# Patient Record
Sex: Female | Born: 1980 | Race: Black or African American | Hispanic: No | Marital: Single | Smoking: Never smoker
Health system: Southern US, Community
[De-identification: ages and names within clinical notes are randomized; demographics above are authoritative.]

## PROBLEM LIST (undated history)

## (undated) DIAGNOSIS — G4730 Sleep apnea, unspecified: Secondary | ICD-10-CM

## (undated) DIAGNOSIS — E119 Type 2 diabetes mellitus without complications: Secondary | ICD-10-CM

## (undated) DIAGNOSIS — E785 Hyperlipidemia, unspecified: Secondary | ICD-10-CM

## (undated) DIAGNOSIS — I10 Essential (primary) hypertension: Secondary | ICD-10-CM

## (undated) HISTORY — DX: Sleep apnea, unspecified: G47.30

---

## 2019-06-21 ENCOUNTER — Other Ambulatory Visit: Payer: Self-pay

## 2019-06-21 ENCOUNTER — Encounter (HOSPITAL_COMMUNITY): Payer: Self-pay

## 2019-06-21 ENCOUNTER — Inpatient Hospital Stay (HOSPITAL_COMMUNITY): Payer: 59 | Admitting: Certified Registered"

## 2019-06-21 ENCOUNTER — Emergency Department (HOSPITAL_COMMUNITY): Payer: 59

## 2019-06-21 ENCOUNTER — Inpatient Hospital Stay (HOSPITAL_COMMUNITY)
Admission: EM | Admit: 2019-06-21 | Discharge: 2019-06-29 | DRG: 854 | Disposition: A | Discharge status: Home Health | Payer: 59 | Attending: Internal Medicine | Admitting: Internal Medicine

## 2019-06-21 ENCOUNTER — Encounter (HOSPITAL_COMMUNITY)
Admission: EM | Disposition: A | Discharge status: Home Health | Payer: Self-pay | Source: Home / Self Care | Attending: Internal Medicine

## 2019-06-21 DIAGNOSIS — B9562 Methicillin resistant Staphylococcus aureus infection as the cause of diseases classified elsewhere: Secondary | ICD-10-CM | POA: Diagnosis present

## 2019-06-21 DIAGNOSIS — Z20828 Contact with and (suspected) exposure to other viral communicable diseases: Secondary | ICD-10-CM | POA: Diagnosis present

## 2019-06-21 DIAGNOSIS — L03115 Cellulitis of right lower limb: Secondary | ICD-10-CM | POA: Diagnosis present

## 2019-06-21 DIAGNOSIS — A419 Sepsis, unspecified organism: Secondary | ICD-10-CM

## 2019-06-21 DIAGNOSIS — IMO0002 Reserved for concepts with insufficient information to code with codable children: Secondary | ICD-10-CM | POA: Diagnosis present

## 2019-06-21 DIAGNOSIS — E1365 Other specified diabetes mellitus with hyperglycemia: Secondary | ICD-10-CM | POA: Diagnosis not present

## 2019-06-21 DIAGNOSIS — D638 Anemia in other chronic diseases classified elsewhere: Secondary | ICD-10-CM | POA: Diagnosis present

## 2019-06-21 DIAGNOSIS — R739 Hyperglycemia, unspecified: Secondary | ICD-10-CM | POA: Insufficient documentation

## 2019-06-21 DIAGNOSIS — E876 Hypokalemia: Secondary | ICD-10-CM | POA: Diagnosis not present

## 2019-06-21 DIAGNOSIS — E1152 Type 2 diabetes mellitus with diabetic peripheral angiopathy with gangrene: Secondary | ICD-10-CM | POA: Diagnosis present

## 2019-06-21 DIAGNOSIS — L02415 Cutaneous abscess of right lower limb: Secondary | ICD-10-CM | POA: Diagnosis present

## 2019-06-21 DIAGNOSIS — Z794 Long term (current) use of insulin: Secondary | ICD-10-CM | POA: Diagnosis not present

## 2019-06-21 DIAGNOSIS — N179 Acute kidney failure, unspecified: Secondary | ICD-10-CM | POA: Diagnosis not present

## 2019-06-21 DIAGNOSIS — Z6841 Body Mass Index (BMI) 40.0 and over, adult: Secondary | ICD-10-CM

## 2019-06-21 DIAGNOSIS — A4102 Sepsis due to Methicillin resistant Staphylococcus aureus: Secondary | ICD-10-CM | POA: Diagnosis present

## 2019-06-21 DIAGNOSIS — I10 Essential (primary) hypertension: Secondary | ICD-10-CM | POA: Diagnosis present

## 2019-06-21 DIAGNOSIS — E1329 Other specified diabetes mellitus with other diabetic kidney complication: Secondary | ICD-10-CM | POA: Diagnosis present

## 2019-06-21 DIAGNOSIS — E785 Hyperlipidemia, unspecified: Secondary | ICD-10-CM | POA: Diagnosis present

## 2019-06-21 DIAGNOSIS — L0291 Cutaneous abscess, unspecified: Principal | ICD-10-CM | POA: Diagnosis present

## 2019-06-21 DIAGNOSIS — E871 Hypo-osmolality and hyponatremia: Secondary | ICD-10-CM | POA: Diagnosis present

## 2019-06-21 DIAGNOSIS — Z885 Allergy status to narcotic agent status: Secondary | ICD-10-CM

## 2019-06-21 DIAGNOSIS — R6520 Severe sepsis without septic shock: Secondary | ICD-10-CM | POA: Diagnosis not present

## 2019-06-21 HISTORY — DX: Essential (primary) hypertension: I10

## 2019-06-21 HISTORY — DX: Type 2 diabetes mellitus without complications: E11.9

## 2019-06-21 HISTORY — DX: Hyperlipidemia, unspecified: E78.5

## 2019-06-21 HISTORY — PX: INCISION AND DRAINAGE OF WOUND: SHX1803

## 2019-06-21 LAB — GLUCOSE, CAPILLARY
Glucose-Capillary: 279 mg/dL — ABNORMAL HIGH (ref 70–99)
Glucose-Capillary: 180 mg/dL — ABNORMAL HIGH (ref 70–99)
Glucose-Capillary: 274 mg/dL — ABNORMAL HIGH (ref 70–99)
Glucose-Capillary: 367 mg/dL — ABNORMAL HIGH (ref 70–99)

## 2019-06-21 LAB — CBC WITH DIFFERENTIAL/PLATELET
Abs Immature Granulocytes: 0.43 10*3/uL — ABNORMAL HIGH (ref 0.00–0.07)
Basophils Absolute: 0.1 10*3/uL (ref 0.0–0.1)
Basophils Relative: 0 %
Eosinophils Absolute: 0 10*3/uL (ref 0.0–0.5)
Eosinophils Relative: 0 %
HCT: 34.7 % — ABNORMAL LOW (ref 36.0–46.0)
Hemoglobin: 11.2 g/dL — ABNORMAL LOW (ref 12.0–15.0)
Immature Granulocytes: 2 %
Lymphocytes Relative: 9 %
Lymphs Abs: 2 10*3/uL (ref 0.7–4.0)
MCH: 25.5 pg — ABNORMAL LOW (ref 26.0–34.0)
MCHC: 32.3 g/dL (ref 30.0–36.0)
MCV: 79 fL — ABNORMAL LOW (ref 80.0–100.0)
Monocytes Absolute: 2.3 10*3/uL — ABNORMAL HIGH (ref 0.1–1.0)
Monocytes Relative: 10 %
Neutro Abs: 17.7 10*3/uL — ABNORMAL HIGH (ref 1.7–7.7)
Neutrophils Relative %: 79 %
Platelets: 311 10*3/uL (ref 150–400)
RBC: 4.39 MIL/uL (ref 3.87–5.11)
RDW: 12.6 % (ref 11.5–15.5)
WBC: 22.5 10*3/uL — ABNORMAL HIGH (ref 4.0–10.5)
nRBC: 0 % (ref 0.0–0.2)

## 2019-06-21 LAB — URINALYSIS, ROUTINE W REFLEX MICROSCOPIC
Bilirubin Urine: NEGATIVE
Glucose, UA: >=500 mg/dL — AB
Ketones, ur: 80 mg/dL — AB
Leukocytes,Ua: NEGATIVE
Nitrite: NEGATIVE
Protein, ur: 30 mg/dL — AB
Specific Gravity, Urine: 1.03 (ref 1.005–1.030)
pH: 5 (ref 5.0–8.0)

## 2019-06-21 LAB — COMPREHENSIVE METABOLIC PANEL
ALT: 13 U/L (ref 0–44)
AST: 14 U/L — ABNORMAL LOW (ref 15–41)
Albumin: 3.1 g/dL — ABNORMAL LOW (ref 3.5–5.0)
Alkaline Phosphatase: 107 U/L (ref 38–126)
Anion gap: 13 (ref 5–15)
BUN: 16 mg/dL (ref 6–20)
CO2: 19 mmol/L — ABNORMAL LOW (ref 22–32)
Calcium: 8.4 mg/dL — ABNORMAL LOW (ref 8.9–10.3)
Chloride: 95 mmol/L — ABNORMAL LOW (ref 98–111)
Creatinine, Ser: 1.04 mg/dL — ABNORMAL HIGH (ref 0.44–1.00)
GFR calc Af Amer: >60 mL/min (ref >60)
GFR calc non Af Amer: >60 mL/min (ref >60)
Glucose, Bld: 423 mg/dL — ABNORMAL HIGH (ref 70–99)
Potassium: 3.6 mmol/L (ref 3.5–5.1)
Sodium: 127 mmol/L — ABNORMAL LOW (ref 135–145)
Total Bilirubin: 1 mg/dL (ref 0.3–1.2)
Total Protein: 7.6 g/dL (ref 6.5–8.1)

## 2019-06-21 LAB — INFLUENZA PANEL BY PCR (TYPE A & B)
Influenza A By PCR: NEGATIVE
Influenza B By PCR: NEGATIVE

## 2019-06-21 LAB — SARS CORONAVIRUS 2 BY RT PCR (HOSPITAL ORDER, PERFORMED IN ~~LOC~~ HOSPITAL LAB): SARS Coronavirus 2: NEGATIVE

## 2019-06-21 LAB — LACTIC ACID, PLASMA
Lactic Acid, Venous: 0.9 mmol/L (ref 0.5–1.9)
Lactic Acid, Venous: 0.9 mmol/L (ref 0.5–1.9)

## 2019-06-21 LAB — SARS CORONAVIRUS 2 (TAT 6-24 HRS): SARS Coronavirus 2: NEGATIVE

## 2019-06-21 LAB — CBG MONITORING, ED
Glucose-Capillary: 350 mg/dL — ABNORMAL HIGH (ref 70–99)
Glucose-Capillary: 316 mg/dL — ABNORMAL HIGH (ref 70–99)

## 2019-06-21 LAB — HIV ANTIBODY (ROUTINE TESTING W REFLEX): HIV Screen 4th Generation wRfx: NONREACTIVE

## 2019-06-21 SURGERY — IRRIGATION AND DEBRIDEMENT WOUND
Anesthesia: General | Site: Thigh

## 2019-06-21 MED ORDER — FENTANYL CITRATE (PF) 250 MCG/5ML IJ SOLN
INTRAMUSCULAR | Status: AC
  Filled 2019-06-21: qty 5

## 2019-06-21 MED ORDER — OXYCODONE HCL 5 MG PO TABS: 5.0000 mg | ORAL_TABLET | Freq: Once | ORAL | Status: DC | PRN

## 2019-06-21 MED ORDER — BUPIVACAINE HCL (PF) 0.25 % IJ SOLN
INTRAMUSCULAR | Status: AC
  Filled 2019-06-21: qty 30

## 2019-06-21 MED ORDER — SODIUM CHLORIDE 0.9 % IV BOLUS
1000.0000 mL | Freq: Once | INTRAVENOUS | Status: AC
  Administered 2019-06-21: 1000 mL via INTRAVENOUS

## 2019-06-21 MED ORDER — LACTATED RINGERS IV SOLN
INTRAVENOUS | Status: DC | PRN
  Administered 2019-06-21: 13:00:00 via INTRAVENOUS

## 2019-06-21 MED ORDER — CELECOXIB 200 MG PO CAPS
200.0000 mg | ORAL_CAPSULE | Freq: Two times a day (BID) | ORAL | Status: DC
  Administered 2019-06-22: 09:00:00 200 mg via ORAL
  Filled 2019-06-21 (×2): qty 1

## 2019-06-21 MED ORDER — INSULIN ASPART 100 UNIT/ML ~~LOC~~ SOLN
0.0000 [IU] | Freq: Every day | SUBCUTANEOUS | Status: DC
  Administered 2019-06-21: 22:00:00 5 [IU] via SUBCUTANEOUS
  Filled 2019-06-21: qty 0.05

## 2019-06-21 MED ORDER — INSULIN ASPART 100 UNIT/ML ~~LOC~~ SOLN
SUBCUTANEOUS | Status: AC
  Filled 2019-06-21: qty 1

## 2019-06-21 MED ORDER — PIPERACILLIN-TAZOBACTAM 3.375 G IVPB 30 MIN
3.3750 g | Freq: Once | INTRAVENOUS | Status: AC
  Administered 2019-06-21: 05:00:00 3.375 g via INTRAVENOUS
  Filled 2019-06-21: qty 50

## 2019-06-21 MED ORDER — BUPIVACAINE HCL (PF) 0.5 % IJ SOLN
INTRAMUSCULAR | Status: DC | PRN
  Administered 2019-06-21: 20 mL

## 2019-06-21 MED ORDER — SODIUM CHLORIDE 0.9 % IV SOLN
INTRAVENOUS | Status: DC
  Administered 2019-06-21 – 2019-06-24 (×7): via INTRAVENOUS

## 2019-06-21 MED ORDER — ONDANSETRON HCL 4 MG/2ML IJ SOLN: 4.0000 mg | Freq: Once | INTRAMUSCULAR | Status: DC | PRN

## 2019-06-21 MED ORDER — FENTANYL CITRATE (PF) 100 MCG/2ML IJ SOLN
50.0000 ug | INTRAMUSCULAR | Status: DC | PRN
  Administered 2019-06-24 – 2019-06-28 (×9): 50 ug via INTRAVENOUS
  Filled 2019-06-21 (×7): qty 2

## 2019-06-21 MED ORDER — GABAPENTIN 100 MG PO CAPS: 100.0000 mg | ORAL_CAPSULE | Freq: Three times a day (TID) | ORAL | Status: DC | PRN

## 2019-06-21 MED ORDER — PROPOFOL 10 MG/ML IV BOLUS
INTRAVENOUS | Status: DC | PRN
  Administered 2019-06-21: 200 mg via INTRAVENOUS

## 2019-06-21 MED ORDER — LISINOPRIL 20 MG PO TABS
40.0000 mg | ORAL_TABLET | Freq: Every day | ORAL | Status: DC
  Administered 2019-06-21 – 2019-06-22 (×2): 40 mg via ORAL
  Filled 2019-06-21 (×2): qty 2

## 2019-06-21 MED ORDER — LIDOCAINE 2% (20 MG/ML) 5 ML SYRINGE
INTRAMUSCULAR | Status: DC | PRN
  Administered 2019-06-21: 100 mg via INTRAVENOUS

## 2019-06-21 MED ORDER — ENOXAPARIN SODIUM 40 MG/0.4ML ~~LOC~~ SOLN: 40.0000 mg | SUBCUTANEOUS | Status: DC

## 2019-06-21 MED ORDER — VITAMIN C 500 MG PO TABS
1000.0000 mg | ORAL_TABLET | Freq: Every day | ORAL | Status: DC
  Administered 2019-06-22 – 2019-06-29 (×8): 1000 mg via ORAL
  Filled 2019-06-21 (×8): qty 2

## 2019-06-21 MED ORDER — TRAMADOL HCL 50 MG PO TABS
50.0000 mg | ORAL_TABLET | Freq: Four times a day (QID) | ORAL | Status: DC | PRN
  Administered 2019-06-21 – 2019-06-22 (×2): 100 mg via ORAL
  Administered 2019-06-22: 50 mg via ORAL
  Administered 2019-06-23 (×2): 100 mg via ORAL
  Administered 2019-06-24: 50 mg via ORAL
  Administered 2019-06-25 – 2019-06-27 (×4): 100 mg via ORAL
  Administered 2019-06-28: 50 mg via ORAL
  Administered 2019-06-28: 22:00:00 100 mg via ORAL
  Filled 2019-06-21 (×5): qty 2
  Filled 2019-06-21: qty 1
  Filled 2019-06-21 (×2): qty 2
  Filled 2019-06-21: qty 1
  Filled 2019-06-21 (×2): qty 2
  Filled 2019-06-21: qty 1

## 2019-06-21 MED ORDER — DEXAMETHASONE SODIUM PHOSPHATE 10 MG/ML IJ SOLN
INTRAMUSCULAR | Status: DC | PRN
  Administered 2019-06-21: 4 mg via INTRAVENOUS

## 2019-06-21 MED ORDER — MIDAZOLAM HCL 2 MG/2ML IJ SOLN
INTRAMUSCULAR | Status: DC | PRN
  Administered 2019-06-21: 1 mg via INTRAVENOUS

## 2019-06-21 MED ORDER — PROPOFOL 10 MG/ML IV BOLUS
INTRAVENOUS | Status: AC
  Filled 2019-06-21: qty 20

## 2019-06-21 MED ORDER — ACETAMINOPHEN 325 MG PO TABS
650.0000 mg | ORAL_TABLET | Freq: Four times a day (QID) | ORAL | Status: DC | PRN
  Administered 2019-06-21 – 2019-06-27 (×2): 650 mg via ORAL
  Filled 2019-06-21 (×2): qty 2

## 2019-06-21 MED ORDER — ENOXAPARIN SODIUM 60 MG/0.6ML ~~LOC~~ SOLN
60.0000 mg | SUBCUTANEOUS | Status: DC
  Administered 2019-06-22 – 2019-06-28 (×7): 60 mg via SUBCUTANEOUS
  Filled 2019-06-21 (×8): qty 0.6

## 2019-06-21 MED ORDER — BIOTIN 1000 MCG PO TABS: 1000.0000 ug | ORAL_TABLET | Freq: Every day | ORAL | Status: DC

## 2019-06-21 MED ORDER — INSULIN ASPART 100 UNIT/ML ~~LOC~~ SOLN
0.0000 [IU] | Freq: Three times a day (TID) | SUBCUTANEOUS | Status: DC
  Administered 2019-06-21: 8 [IU] via SUBCUTANEOUS
  Administered 2019-06-21: 11 [IU] via SUBCUTANEOUS
  Administered 2019-06-21: 18:00:00 8 [IU] via SUBCUTANEOUS
  Administered 2019-06-22: 15 [IU] via SUBCUTANEOUS
  Administered 2019-06-22: 8 [IU] via SUBCUTANEOUS
  Filled 2019-06-21: qty 0.15

## 2019-06-21 MED ORDER — VITAMIN C 500 MG PO TABS: 1000.0000 mg | ORAL_TABLET | Freq: Three times a day (TID) | ORAL | Status: DC

## 2019-06-21 MED ORDER — VANCOMYCIN HCL 10 G IV SOLR
2500.0000 mg | Freq: Once | INTRAVENOUS | Status: AC
  Administered 2019-06-21: 11:00:00 2500 mg via INTRAVENOUS
  Filled 2019-06-21: qty 2000

## 2019-06-21 MED ORDER — FENTANYL CITRATE (PF) 100 MCG/2ML IJ SOLN: 25.0000 ug | INTRAMUSCULAR | Status: DC | PRN

## 2019-06-21 MED ORDER — MIDAZOLAM HCL 2 MG/2ML IJ SOLN
INTRAMUSCULAR | Status: AC
  Filled 2019-06-21: qty 2

## 2019-06-21 MED ORDER — BUPIVACAINE HCL (PF) 0.5 % IJ SOLN
INTRAMUSCULAR | Status: AC
  Filled 2019-06-21: qty 30

## 2019-06-21 MED ORDER — ONDANSETRON HCL 4 MG/2ML IJ SOLN
INTRAMUSCULAR | Status: DC | PRN
  Administered 2019-06-21: 4 mg via INTRAVENOUS

## 2019-06-21 MED ORDER — FENTANYL CITRATE (PF) 250 MCG/5ML IJ SOLN
INTRAMUSCULAR | Status: DC | PRN
  Administered 2019-06-21 (×5): 50 ug via INTRAVENOUS

## 2019-06-21 MED ORDER — VANCOMYCIN HCL IN DEXTROSE 1-5 GM/200ML-% IV SOLN
1000.0000 mg | Freq: Two times a day (BID) | INTRAVENOUS | Status: DC
  Administered 2019-06-21: 22:00:00 1000 mg via INTRAVENOUS
  Filled 2019-06-21 (×2): qty 200

## 2019-06-21 MED ORDER — OXYCODONE HCL 5 MG/5ML PO SOLN: 5.0000 mg | Freq: Once | ORAL | Status: DC | PRN

## 2019-06-21 SURGICAL SUPPLY — 30 items
BLADE SURG 15 STRL LF DISP TIS (BLADE) ×1 IMPLANT
BLADE SURG 15 STRL SS (BLADE) ×2
BNDG GAUZE ELAST 4 BULKY (GAUZE/BANDAGES/DRESSINGS) ×2 IMPLANT
COVER WAND RF STERILE (DRAPES) IMPLANT
DECANTER SPIKE VIAL GLASS SM (MISCELLANEOUS) IMPLANT
DRAPE LAPAROSCOPIC ABDOMINAL (DRAPES) IMPLANT
DRSG PAD ABDOMINAL 8X10 ST (GAUZE/BANDAGES/DRESSINGS) IMPLANT
ELECT REM PT RETURN 15FT ADLT (MISCELLANEOUS) ×3 IMPLANT
GAUZE SPONGE 4X4 12PLY STRL (GAUZE/BANDAGES/DRESSINGS) IMPLANT
GLOVE BIO SURGEON STRL SZ7.5 (GLOVE) ×3 IMPLANT
GOWN SPEC L4 XLG W/TWL (GOWN DISPOSABLE) ×3 IMPLANT
GOWN STRL REUS W/ TWL XL LVL3 (GOWN DISPOSABLE) ×3 IMPLANT
GOWN STRL REUS W/TWL XL LVL3 (GOWN DISPOSABLE) ×6
KIT BASIN OR (CUSTOM PROCEDURE TRAY) ×3 IMPLANT
KIT TURNOVER KIT A (KITS) IMPLANT
NDL HYPO 25X1 1.5 SAFETY (NEEDLE) IMPLANT
NEEDLE HYPO 25X1 1.5 SAFETY (NEEDLE) IMPLANT
PACK BASIC VI WITH GOWN DISP (CUSTOM PROCEDURE TRAY) ×3 IMPLANT
PENCIL SMOKE EVACUATOR (MISCELLANEOUS) ×2 IMPLANT
SPONGE LAP 18X18 RF (DISPOSABLE) ×3 IMPLANT
SUT MNCRL AB 4-0 PS2 18 (SUTURE) IMPLANT
SUT VIC AB 3-0 SH 27 (SUTURE)
SUT VIC AB 3-0 SH 27XBRD (SUTURE) IMPLANT
SWAB COLLECTION DEVICE MRSA (MISCELLANEOUS) ×2 IMPLANT
SWAB CULTURE ESWAB REG 1ML (MISCELLANEOUS) ×2 IMPLANT
SYR 20ML LL LF (SYRINGE) IMPLANT
SYR BULB IRRIGATION 50ML (SYRINGE) ×3 IMPLANT
TOWEL OR 17X26 10 PK STRL BLUE (TOWEL DISPOSABLE) ×3 IMPLANT
TOWEL OR NON WOVEN STRL DISP B (DISPOSABLE) ×3 IMPLANT
YANKAUER SUCT BULB TIP 10FT TU (MISCELLANEOUS) ×3 IMPLANT

## 2019-06-21 NOTE — Op Note (Signed)
   Amber Jensen 93/02/1830   Pre-op Diagnosis: right proximal/medial thigh abscess     Post-op Diagnosis: same  Procedure(s): INCISION AND DRAINAGE ABSCESS RIGHT PROXIMAL/MEDIAL THIGH  Surgeon(s): Coralie Keens, MD  Anesthesia: General  Staff:  Circulator: Gillis Santa, RN Scrub Person: Rogene Houston Circulator Assistant: [place], Ra Novia  Estimated Blood Loss: Minimal               Specimens: cultures sent  Procedure: The patient was brought to the operating room and identifies correct patient.  She was placed upon the operating room table and general anesthesia was induced.  I then placed her in the frog-leg position.  Her medial/proximal right thigh was then prepped and draped in usual sterile fashion.  There was already an open area draining purulence.  Cultures were obtained.  I extended the draining area medially and laterally with a scalpel.  I dissected down to the subcutaneous tissue bluntly and found a large abscess cavity.  A large amount of purulence was drained from the abscess cavity.  There was no necrotic tissue involving the surrounding fat or muscle planes.  I then irrigated the wound with a liter of saline.  I anesthetized the wound with Marcaine.  I then packed the open wound with wet-to-dry saline soaked Kerlix gauze.  Dry gauze was then placed over this.  The patient tolerated the procedure well.  All the counts were correct at the end of the procedure.  The patient was then extubated in the operating and taken in a stable condition to the recovery room.          Coralie Keens   Date: 06/21/2019  Time: 1:54 PM

## 2019-06-21 NOTE — Anesthesia Procedure Notes (Signed)
Date/Time: 06/21/2019 1:53 PM Performed by: Cynda Familia, CRNA Oxygen Delivery Method: Simple face mask Placement Confirmation: positive ETCO2 and breath sounds checked- equal and bilateral Dental Injury: Teeth and Oropharynx as per pre-operative assessment

## 2019-06-21 NOTE — ED Notes (Signed)
She  Has just been seen by the surgeon, who informed her that she will need a minor operation for drainage of the abscess.

## 2019-06-21 NOTE — H&P (Addendum)
CC/Reason for consult: Right medial thigh abscess  Requesting provider: Charlann Lange, PA-C  HPI: Amber Jensen is an 38 y.o. female with hx of HTN, HLD, poorly controlled DM presents to the emergency room with complaints of 5 days of worsening right medial thigh discomfort that in the last 24 hours has began to spontaneously drain purulent fluid.  She reports having had a prior right groin abscess in the past.  She had to have this drained.  This current 1 feels similar to one before but is larger.  She denies fever/chills.  She reports the pain is sharp and nonradiating.  Nothing seems to make it better or worse.  Past Medical History:  Diagnosis Date  . Diabetes mellitus without complication ([place])   . Hyperlipidemia   . Hypertension     History reviewed. No pertinent surgical history.  No family history on file.  Social:  has no history on file for tobacco, alcohol, and drug.  Allergies:  Allergies  Allergen Reactions  . Percocet [Oxycodone-Acetaminophen] Other (See Comments)    Almost passed out   . Vicodin [Hydrocodone-Acetaminophen] Other (See Comments)    Almost passed out     Medications: I have reviewed the patient's current medications.  Results for orders placed or performed during the hospital encounter of 06/21/19 (from the past 48 hour(s))  CBC with Differential     Status: Abnormal   Collection Time: 06/21/19  1:49 AM  Result Value Ref Range   WBC 22.5 (H) 4.0 - 10.5 K/uL   RBC 4.39 3.87 - 5.11 MIL/uL   Hemoglobin 11.2 (L) 12.0 - 15.0 g/dL   HCT 34.7 (L) 36.0 - 46.0 %   MCV 79.0 (L) 80.0 - 100.0 fL   MCH 25.5 (L) 26.0 - 34.0 pg   MCHC 32.3 30.0 - 36.0 g/dL   RDW 12.6 11.5 - 15.5 %   Platelets 311 150 - 400 K/uL   nRBC 0.0 0.0 - 0.2 %   Neutrophils Relative % 79 %   Neutro Abs 17.7 (H) 1.7 - 7.7 K/uL   Lymphocytes Relative 9 %   Lymphs Abs 2.0 0.7 - 4.0 K/uL   Monocytes Relative 10 %   Monocytes Absolute 2.3 (H) 0.1 - 1.0 K/uL   Eosinophils  Relative 0 %   Eosinophils Absolute 0.0 0.0 - 0.5 K/uL   Basophils Relative 0 %   Basophils Absolute 0.1 0.0 - 0.1 K/uL   Immature Granulocytes 2 %   Abs Immature Granulocytes 0.43 (H) 0.00 - 0.07 K/uL    Comment: Performed at [facility], [place] [address] [place], [place] [address]  Comprehensive metabolic panel     Status: Abnormal   Collection Time: 06/21/19  1:49 AM  Result Value Ref Range   Sodium 127 (L) 135 - 145 mmol/L   Potassium 3.6 3.5 - 5.1 mmol/L   Chloride 95 (L) 98 - 111 mmol/L   CO2 19 (L) 22 - 32 mmol/L   Glucose, Bld 423 (H) 70 - 99 mg/dL   BUN 16 6 - 20 mg/dL   Creatinine, Ser 1.04 (H) 0.44 - 1.00 mg/dL   Calcium 8.4 (L) 8.9 - 10.3 mg/dL   Total Protein 7.6 6.5 - 8.1 g/dL   Albumin 3.1 (L) 3.5 - 5.0 g/dL   AST 14 (L) 15 - 41 U/L   ALT 13 0 - 44 U/L   Alkaline Phosphatase 107 38 - 126 U/L   Total Bilirubin 1.0 0.3 - 1.2 mg/dL   GFR  calc non Af Amer >60 >60 mL/min   GFR calc Af Amer >60 >60 mL/min   Anion gap 13 5 - 15    Comment: Performed at [facility], [address] [place], [place] [address]  Influenza panel by PCR (type A & B)     Status: None   Collection Time: 06/21/19  1:51 AM  Result Value Ref Range   Influenza A By PCR NEGATIVE NEGATIVE   Influenza B By PCR NEGATIVE NEGATIVE    Comment: (NOTE) The Xpert Xpress Flu assay is intended as an aid in the diagnosis of  influenza and should not be used as a sole basis for treatment.  This  assay is FDA approved for nasopharyngeal swab specimens only. Nasal  washings and aspirates are unacceptable for Xpert Xpress Flu testing. Performed at [facility], [address], [place], [place] [address]   POC CBG, ED     Status: Abnormal   Collection Time: 06/21/19  4:42 AM  Result Value Ref Range   Glucose-Capillary 350 (H) 70 - 99 mg/dL  Lactic acid, plasma     Status: None   Collection Time: 06/21/19  4:55 AM  Result Value Ref Range   Lactic  Acid, Venous 0.9 0.5 - 1.9 mmol/L    Comment: Performed at [facility], [address], [place], [place] [address]  Urinalysis, Routine w reflex microscopic     Status: Abnormal   Collection Time: 06/21/19  5:18 AM  Result Value Ref Range   Color, Urine YELLOW YELLOW   APPearance HAZY (A) CLEAR   Specific Gravity, Urine 1.030 1.005 - 1.030   pH 5.0 5.0 - 8.0   Glucose, UA >=500 (A) NEGATIVE mg/dL   Hgb urine dipstick MODERATE (A) NEGATIVE   Bilirubin Urine NEGATIVE NEGATIVE   Ketones, ur 80 (A) NEGATIVE mg/dL   Protein, ur 30 (A) NEGATIVE mg/dL   Nitrite NEGATIVE NEGATIVE   Leukocytes,Ua NEGATIVE NEGATIVE   RBC / HPF 0-5 0 - 5 RBC/hpf   WBC, UA 6-10 0 - 5 WBC/hpf   Bacteria, UA RARE (A) NONE SEEN   Squamous Epithelial / LPF 11-20 0 - 5   Mucus PRESENT    Hyaline Casts, UA PRESENT     Comment: Performed at [facility], [address] [place], [place] [address]  Lactic acid, plasma     Status: None   Collection Time: 06/21/19  6:58 AM  Result Value Ref Range   Lactic Acid, Venous 0.9 0.5 - 1.9 mmol/L    Comment: Performed at [facility], [address], [place], [place] [address]  POC CBG, ED     Status: Abnormal   Collection Time: 06/21/19  8:03 AM  Result Value Ref Range   Glucose-Capillary 316 (H) 70 - 99 mg/dL    Dg Chest 2 View  Result Date: 06/21/2019 CLINICAL DATA:  Shortness of breath.  Fever. EXAM: CHEST - 2 VIEW COMPARISON:  None. FINDINGS: The heart size and mediastinal contours are within normal limits. Both lungs are clear. The visualized skeletal structures are unremarkable. IMPRESSION: No active cardiopulmonary disease. Electronically Signed   By: Gerome Sam III M.D   On: 06/21/2019 01:15    ROS - all of the below systems have been reviewed with the patient and positives are indicated with bold text General: chills, fever or night sweats Eyes: blurry vision or double vision ENT: epistaxis or sore  throat Allergy/Immunology: itchy/watery eyes or  nasal congestion Hematologic/Lymphatic: bleeding problems, blood clots or swollen lymph nodes Endocrine: temperature intolerance or unexpected weight changes Breast: new or changing breast lumps or nipple discharge Resp: cough, shortness of breath, or wheezing CV: chest pain or dyspnea on exertion GI: as per HPI GU: dysuria, trouble voiding, or hematuria MSK: joint pain or joint stiffness Neuro: TIA or stroke symptoms Derm: pruritus and skin lesion changes Psych: anxiety and depression  PE Blood pressure (!) 153/88, pulse (!) 117, temperature 99.4 F (37.4 C), temperature source Oral, resp. rate 18, last menstrual period 06/06/2019, SpO2 100 %. Constitutional: NAD; conversant; no deformities Eyes: Moist conjunctiva; no lid lag; anicteric; PERRL Neck: Trachea midline; no thyromegaly Lungs: Normal respiratory effort; no tactile fremitus CV: RRR; no palpable thrills; no pitting edema GI: Abd obese, soft, NT/ND; no palpable hepatosplenomegaly MSK: Right medial thigh with ~5 mm opening draining purulent fluid; at least 15 x 15 cm area of induration and erythema extending outwardly from this.  Psychiatric: Appropriate affect; alert and oriented x3 Lymphatic: No palpable cervical or axillary lymphadenopathy  Results for orders placed or performed during the hospital encounter of 06/21/19 (from the past 48 hour(s))  CBC with Differential     Status: Abnormal   Collection Time: 06/21/19  1:49 AM  Result Value Ref Range   WBC 22.5 (H) 4.0 - 10.5 K/uL   RBC 4.39 3.87 - 5.11 MIL/uL   Hemoglobin 11.2 (L) 12.0 - 15.0 g/dL   HCT 81.8 (L) 56.3 - 14.9 %   MCV 79.0 (L) 80.0 - 100.0 fL   MCH 25.5 (L) 26.0 - 34.0 pg   MCHC 32.3 30.0 - 36.0 g/dL   RDW 70.2 63.7 - 85.8 %   Platelets 311 150 - 400 K/uL   nRBC 0.0 0.0 - 0.2 %   Neutrophils Relative % 79 %   Neutro Abs 17.7 (H) 1.7 - 7.7 K/uL   Lymphocytes Relative 9 %   Lymphs Abs 2.0 0.7 - 4.0 K/uL    Monocytes Relative 10 %   Monocytes Absolute 2.3 (H) 0.1 - 1.0 K/uL   Eosinophils Relative 0 %   Eosinophils Absolute 0.0 0.0 - 0.5 K/uL   Basophils Relative 0 %   Basophils Absolute 0.1 0.0 - 0.1 K/uL   Immature Granulocytes 2 %   Abs Immature Granulocytes 0.43 (H) 0.00 - 0.07 K/uL    Comment: Performed at [facility], [address], [place], [place] [address]  Comprehensive metabolic panel     Status: Abnormal   Collection Time: 06/21/19  1:49 AM  Result Value Ref Range   Sodium 127 (L) 135 - 145 mmol/L   Potassium 3.6 3.5 - 5.1 mmol/L   Chloride 95 (L) 98 - 111 mmol/L   CO2 19 (L) 22 - 32 mmol/L   Glucose, Bld 423 (H) 70 - 99 mg/dL   BUN 16 6 - 20 mg/dL   Creatinine, Ser 7.41 (H) 0.44 - 1.00 mg/dL   Calcium 8.4 (L) 8.9 - 10.3 mg/dL   Total Protein 7.6 6.5 - 8.1 g/dL   Albumin 3.1 (L) 3.5 - 5.0 g/dL   AST 14 (L) 15 - 41 U/L   ALT 13 0 - 44 U/L   Alkaline Phosphatase 107 38 - 126 U/L   Total Bilirubin 1.0 0.3 - 1.2 mg/dL   GFR calc non Af Amer >60 >60 mL/min   GFR calc Af Amer >60 >60 mL/min   Anion gap 13 5 - 15    Comment: Performed  at [facility], [address] [place], [place] [address]  Influenza panel by PCR (type A & B)     Status: None   Collection Time: 06/21/19  1:51 AM  Result Value Ref Range   Influenza A By PCR NEGATIVE NEGATIVE   Influenza B By PCR NEGATIVE NEGATIVE    Comment: (NOTE) The Xpert Xpress Flu assay is intended as an aid in the diagnosis of  influenza and should not be used as a sole basis for treatment.  This  assay is FDA approved for nasopharyngeal swab specimens only. Nasal  washings and aspirates are unacceptable for Xpert Xpress Flu testing. Performed at [facility], [address] [place], [place] [address]   POC CBG, ED     Status: Abnormal   Collection Time: 06/21/19  4:42 AM  Result Value Ref Range   Glucose-Capillary 350 (H) 70 - 99 mg/dL  Lactic acid, plasma      Status: None   Collection Time: 06/21/19  4:55 AM  Result Value Ref Range   Lactic Acid, Venous 0.9 0.5 - 1.9 mmol/L    Comment: Performed at [facility], [address] [place], [place] [address]  Urinalysis, Routine w reflex microscopic     Status: Abnormal   Collection Time: 06/21/19  5:18 AM  Result Value Ref Range   Color, Urine YELLOW YELLOW   APPearance HAZY (A) CLEAR   Specific Gravity, Urine 1.030 1.005 - 1.030   pH 5.0 5.0 - 8.0   Glucose, UA >=500 (A) NEGATIVE mg/dL   Hgb urine dipstick MODERATE (A) NEGATIVE   Bilirubin Urine NEGATIVE NEGATIVE   Ketones, ur 80 (A) NEGATIVE mg/dL   Protein, ur 30 (A) NEGATIVE mg/dL   Nitrite NEGATIVE NEGATIVE   Leukocytes,Ua NEGATIVE NEGATIVE   RBC / HPF 0-5 0 - 5 RBC/hpf   WBC, UA 6-10 0 - 5 WBC/hpf   Bacteria, UA RARE (A) NONE SEEN   Squamous Epithelial / LPF 11-20 0 - 5   Mucus PRESENT    Hyaline Casts, UA PRESENT     Comment: Performed at [facility], [address] [place], [place] [address]  Lactic acid, plasma     Status: None   Collection Time: 06/21/19  6:58 AM  Result Value Ref Range   Lactic Acid, Venous 0.9 0.5 - 1.9 mmol/L    Comment: Performed at [facility], [address] [place], [place] [address]  POC CBG, ED     Status: Abnormal   Collection Time: 06/21/19  8:03 AM  Result Value Ref Range   Glucose-Capillary 316 (H) 70 - 99 mg/dL    Dg Chest 2 View  Result Date: 06/21/2019 CLINICAL DATA:  Shortness of breath.  Fever. EXAM: CHEST - 2 VIEW COMPARISON:  None. FINDINGS: The heart size and mediastinal contours are within normal limits. Both lungs are clear. The visualized skeletal structures are unremarkable. IMPRESSION: No active cardiopulmonary disease. Electronically Signed   By: Gerome Samavid  Williams III M.D   On: 06/21/2019 01:15    A/P: Dorna MaiShelicia Craighead is an 38 y.o. female with HTN, HLD, poorly controlled DM here with abscess of right medial thigh   -The  anatomy and physiology of the thigh was discussed at length with the patient. The pathophysiology of abscesses was discussed at length as well. -We discussed incision/drainage/possible debridement of right thigh abscess and potential for counter incision/placement of penrose drain.  We discussed potential for needing additional  debridements based on findings and progression over time.  We discussed long-term importance of glycemic control to prevent recurrence of this issue -We discussed that this procedure may be done by my partner, Dr. Magnus Ivan, whom she would meet prior to procedure as well -The planned procedure, material risks (including, but not limited to, pain, bleeding, infection, scarring, need for additional procedures, recurrence, pneumonia, heart attack, stroke, death) benefits and alternatives to surgery were discussed at length. The patient's questions were answered to her satisfaction, she voiced understanding and elected to proceed with surgery. Additionally, we discussed typical postoperative expectations and the recovery process.  Stephanie Coup. Cliffton Asters, M.D. [facility] Use [email or web address] to contact on call provider

## 2019-06-21 NOTE — Anesthesia Preprocedure Evaluation (Addendum)
Anesthesia Evaluation   Patient awake    Reviewed: Allergy & Precautions, NPO status , Patient's Chart, lab work & pertinent test results  History of Anesthesia Complications Negative for: history of anesthetic complications  Airway Mallampati: II  TM Distance: >3 FB Neck ROM: Full    Dental  (+) Teeth Intact   Pulmonary neg pulmonary ROS,    Pulmonary exam normal        Cardiovascular hypertension, Pt. on medications Normal cardiovascular exam     Neuro/Psych negative neurological ROS  negative psych ROS   GI/Hepatic negative GI ROS, Neg liver ROS,   Endo/Other  diabetes, Poorly Controlled, Insulin Dependent, Oral Hypoglycemic AgentsMorbid obesity  Renal/GU negative Renal ROS  negative genitourinary   Musculoskeletal negative musculoskeletal ROS (+)   Abdominal   Peds  Hematology negative hematology ROS (+)   Anesthesia Other Findings   Reproductive/Obstetrics                           Anesthesia Physical Anesthesia Plan  ASA: III and emergent  Anesthesia Plan: General   Post-op Pain Management:    Induction: Intravenous  PONV Risk Score and Plan: 3 and Ondansetron, Dexamethasone, Treatment may vary due to age or medical condition and Midazolam  Airway Management Planned: LMA  Additional Equipment: None  Intra-op Plan:   Post-operative Plan: Extubation in OR  Informed Consent: I have reviewed the patients History and Physical, chart, labs and discussed the procedure including the risks, benefits and alternatives for the proposed anesthesia with the patient or authorized representative who has indicated his/her understanding and acceptance.     Dental advisory given  Plan Discussed with:   Anesthesia Plan Comments:       Anesthesia Quick Evaluation

## 2019-06-21 NOTE — ED Notes (Signed)
CHG bath performed; consent signed and witnessed; and she is taken to O.R. by my colleague now. She is sitting up and in no distress.

## 2019-06-21 NOTE — Progress Notes (Signed)
Pt transported to PACU via Bed accompanied by RN and nurse tech; Patient alert and oriented x 4, no signs and symptoms of distress. Handed off to PACU RN

## 2019-06-21 NOTE — ED Triage Notes (Signed)
Pt coming from home c/o fatigue, body aches, dizziness, chills and fever x5 days. No loss of smell or taste. Pt also c/o boil on right inner thigh that started 5 days ago too.

## 2019-06-21 NOTE — Progress Notes (Signed)
Patient ID: Amber Jensen, female   DOB: 1981-05-28, 38 y.o.   MRN: [id number]   Pre Procedure note for inpatients: right thigh abscess   Amber Jensen has been scheduled for Procedure(s): INCISION AND DRAINAGE ABSCESS groin/thigh (N/A) today. The various methods of treatment have been discussed with the patient. After consideration of the risks, benefits and treatment options the patient has consented to the planned procedure.   The patient has been seen and labs reviewed. There are no changes in the patient's condition to prevent proceeding with the planned procedure today.  Recent labs:  Lab Results  Component Value Date   WBC 22.5 (H) 06/21/2019   HGB 11.2 (L) 06/21/2019   HCT 34.7 (L) 06/21/2019   PLT 311 06/21/2019   GLUCOSE 423 (H) 06/21/2019   ALT 13 06/21/2019   AST 14 (L) 06/21/2019   NA 127 (L) 06/21/2019   K 3.6 06/21/2019   CL 95 (L) 06/21/2019   CREATININE 1.04 (H) 06/21/2019   BUN 16 06/21/2019   CO2 19 (L) 06/21/2019    Coralie Keens, MD 06/21/2019 1:21 PM

## 2019-06-21 NOTE — Transfer of Care (Signed)
Immediate Anesthesia Transfer of Care Note  Patient: Amber Jensen  Procedure(s) Performed: INCISION AND DRAINAGE ABSCESS groin/thigh (N/A Thigh)  Patient Location: PACU  Anesthesia Type:General  Level of Consciousness: awake and alert   Airway & Oxygen Therapy: Patient Spontanous Breathing and Patient connected to face mask oxygen  Post-op Assessment: Report given to RN and Post -op Vital signs reviewed and stable  Post vital signs: Reviewed and stable  Last Vitals:  Vitals Value Taken Time  BP 140/75 06/21/19 1415  Temp 37.4 C 06/21/19 1403  Pulse 113 06/21/19 1423  Resp 33 06/21/19 1423  SpO2 100 % 06/21/19 1423  Vitals shown include unvalidated device data.  Last Pain:  Vitals:   06/21/19 1415  TempSrc:   PainSc: 0-No pain         Complications: No apparent anesthesia complications

## 2019-06-21 NOTE — ED Notes (Signed)
I get a call from O.R. at this time to take pt. To the O.R. I inform them that I have no order for a consent. They recommend I contact the surgeon, which I do. Her belongings are locked into TCU locker #[id number] at this time.

## 2019-06-21 NOTE — Anesthesia Procedure Notes (Signed)
Procedure Name: LMA Insertion Date/Time: 06/21/2019 1:36 PM Performed by: Cynda Familia, CRNA Pre-anesthesia Checklist: Patient identified, Emergency Drugs available, Suction available and Patient being monitored Patient Re-evaluated:Patient Re-evaluated prior to induction Oxygen Delivery Method: Circle System Utilized Preoxygenation: Pre-oxygenation with 100% oxygen Induction Type: IV induction Ventilation: Mask ventilation without difficulty LMA: LMA inserted and LMA with gastric port inserted LMA Size: 4.0 Number of attempts: 1 Placement Confirmation: positive ETCO2 Tube secured with: Tape Dental Injury: Teeth and Oropharynx as per pre-operative assessment  Comments: Smooth induction Witman-- LMA insertion AM CRNA atraumatic-- teeth and mouth as preop  bilat BS

## 2019-06-21 NOTE — H&P (Addendum)
History and Physical    Emmett Hepworth IPJ:[id number] DOB: [birth year] DOA: 06/21/2019  PCP: System, Pcp Not In Patient coming from: Home Chief Complaint abscess HPI: Amber Jensen is a 38 y.o. female with medical history significant of poorly controlled diabetes with hypertension came into the hospital with complaints of right upper medial thigh swelling started Monday 5 days prior to admission to the hospital.  She said she did not feel anything was wrong she just started to go away.  She has had a history of groin abscess in the past.  The abscess started draining so she did not come to the hospital.  She had chills and rigors with no documented fever.  She describes she has severe right upper medial thigh pain with walking the pain is sharp in nature.  She denies nausea vomiting diarrhea abdominal pain urinary complaints.  She denies cough chest pain. She did not take her prescribed medications due to nausea.  Planes of generalized weakness and fatigue and dizziness.  No loss of smell or taste.  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 Her blood sugar was over 500 at home.  She stopped taking insulin 4 days prior to admission due to decreased appetite and nausea. Rapid Covid test is pending. ED Course her blood sugar was over 400.  Review of Systems: As per HPI otherwise all other systems reviewed and are negative  Ambulatory Status she is ambulatory at baseline.  Past Medical History:  Diagnosis Date  . Diabetes mellitus without complication (HCC)   . Hyperlipidemia   . Hypertension     History reviewed. No pertinent surgical history.  Social History   Socioeconomic History  . Marital status: Single    Spouse name: Not on file  . Number of children: Not on file  . Years of education: Not on file  . Highest education level: Not on file  Occupational History  . Not on file  Social Needs  . Financial resource strain: Not on file  . Food insecurity     Worry: Not on file    Inability: Not on file  . Transportation needs    Medical: Not on file    Non-medical: Not on file  Tobacco Use  . Smoking status: Not on file  Substance and Sexual Activity  . Alcohol use: Not on file  . Drug use: Not on file  . Sexual activity: Not on file  Lifestyle  . Physical activity    Days per week: Not on file    Minutes per session: Not on file  . Stress: Not on file  Relationships  . Social Musician on phone: Not on file    Gets together: Not on file    Attends religious service: Not on file    Active member of club or organization: Not on file    Attends meetings of clubs or organizations: Not on file    Relationship status: Not on file  . Intimate partner violence    Fear of current or ex partner: Not on file    Emotionally abused: Not on file    Physically abused: Not on file    Forced sexual activity: Not on file  Other Topics Concern  . Not on file  Social History Narrative  . Not on file    Allergies  Allergen Reactions  . Percocet [Oxycodone-Acetaminophen] Other (See Comments)    Almost passed out   . Vicodin [  Hydrocodone-Acetaminophen] Other (See Comments)    Almost passed out     No family history on file.    Prior to Admission medications   Medication Sig Start Date End Date Taking? Authorizing Provider  Ascorbic Acid (VITAMIN C) 1000 MG tablet Take 1,000 mg by mouth 3 (three) times daily.   Yes [provider]  Biotin 1000 MCG tablet Take 1,000 mcg by mouth daily.   Yes [provider]  gabapentin (NEURONTIN) 100 MG capsule Take 100 mg by mouth 3 (three) times daily as needed (pain).   Yes [provider]  hydrochlorothiazide (HYDRODIURIL) 25 MG tablet Take 25 mg by mouth daily.   Yes [provider]  ibuprofen (ADVIL) 200 MG tablet Take 200 mg by mouth every 6 (six) hours as needed for moderate pain.   Yes [provider]  insulin detemir (LEVEMIR) 100  UNIT/ML injection Inject 25-45 Units into the skin See admin instructions. 25 units in the am and 45 units at bedtime   Yes [provider]  lisinopril (ZESTRIL) 40 MG tablet Take 40 mg by mouth daily.   Yes [provider]  metFORMIN (GLUCOPHAGE-XR) 500 MG 24 hr tablet Take 500 mg by mouth daily with breakfast.   Yes [provider]  Multiple Vitamin (MULTIVITAMIN WITH MINERALS) TABS tablet Take 1 tablet by mouth daily.   Yes [provider]  pioglitazone (ACTOS) 45 MG tablet Take 45 mg by mouth daily.   Yes [provider]    Physical Exam: Vitals:   06/21/19 0630 06/21/19 0700 06/21/19 0730 06/21/19 0800  BP: (!) 151/96 (!) 151/88 (!) 154/89 (!) 153/88  Pulse: (!) 113 (!) 111 (!) 111 (!) 117  Resp:  18    Temp:      TempSrc:      SpO2: 99% 100% 100% 100%     . General:  Appears calm and comfortable . Eyes: PERRL, EOMI, normal lids, iris . ENT: grossly normal hearing, lips & tongue, mmm . Neck: no LAD, masses or thyromegaly . Cardiovascular: RRR, no m/r/g. No LE edema.  Marland Kitchen Respiratory:  CTA bilaterally, no w/r/r. Normal respiratory effort. . Abdomen:  soft, ntnd, NABS . Skin: no rash or induration seen on limited exam . Musculoskeletal:  Right medial thigh abscess draining  pus . Psychiatric: grossly normal mood and affect, speech fluent and appropriate, AOx3 . Neurologic: CN 2-12 grossly intact, moves all extremities in coordinated fashion, sensation intact  Labs on Admission: I have personally reviewed following labs and imaging studies  CBC: Recent Labs  Lab 06/21/19 0149  WBC 22.5*  NEUTROABS 17.7*  HGB 11.2*  HCT 34.7*  MCV 79.0*  PLT 311   Basic Metabolic Panel: Recent Labs  Lab 06/21/19 0149  NA 127*  K 3.6  CL 95*  CO2 19*  GLUCOSE 423*  BUN 16  CREATININE 1.04*  CALCIUM 8.4*   GFR: CrCl cannot be calculated (Unknown ideal weight.). Liver Function Tests: Recent Labs  Lab 06/21/19 0149  AST 14*  ALT  13  ALKPHOS 107  BILITOT 1.0  PROT 7.6  ALBUMIN 3.1*   No results for input(s): LIPASE, AMYLASE in the last 168 hours. No results for input(s): AMMONIA in the last 168 hours. Coagulation Profile: No results for input(s): INR, PROTIME in the last 168 hours. Cardiac Enzymes: No results for input(s): CKTOTAL, CKMB, CKMBINDEX, TROPONINI in the last 168 hours. BNP (last 3 results) No results for input(s): PROBNP in the last 8760 hours. HbA1C: No  results for input(s): HGBA1C in the last 72 hours. CBG: Recent Labs  Lab 06/21/19 0442 06/21/19 0803  GLUCAP 350* 316*   Lipid Profile: No results for input(s): CHOL, HDL, LDLCALC, TRIG, CHOLHDL, LDLDIRECT in the last 72 hours. Thyroid Function Tests: No results for input(s): TSH, T4TOTAL, FREET4, T3FREE, THYROIDAB in the last 72 hours. Anemia Panel: No results for input(s): VITAMINB12, FOLATE, FERRITIN, TIBC, IRON, RETICCTPCT in the last 72 hours. Urine analysis:    Component Value Date/Time   COLORURINE YELLOW 06/21/2019 0518   APPEARANCEUR HAZY (A) 06/21/2019 0518   LABSPEC 1.030 06/21/2019 0518   PHURINE 5.0 06/21/2019 0518   GLUCOSEU >=500 (A) 06/21/2019 0518   HGBUR MODERATE (A) 06/21/2019 0518   BILIRUBINUR NEGATIVE 06/21/2019 0518   KETONESUR 80 (A) 06/21/2019 0518   PROTEINUR 30 (A) 06/21/2019 0518   NITRITE NEGATIVE 06/21/2019 0518   LEUKOCYTESUR NEGATIVE 06/21/2019 0518    Creatinine Clearance: CrCl cannot be calculated (Unknown ideal weight.).  Sepsis Labs: @LABRCNTIP (procalcitonin:4,lacticidven:4) ) Recent Results (from the past 240 hour(s))  SARS Coronavirus 2 by RT PCR (hospital order, performed in [facility] lab) Nasopharyngeal Nasopharyngeal Swab     Status: None   Collection Time: 06/21/19  7:00 AM   Specimen: Nasopharyngeal Swab  Result Value Ref Range Status   SARS Coronavirus 2 NEGATIVE NEGATIVE Final    Comment: (NOTE) SARS-CoV-2 target nucleic acids are NOT DETECTED. The SARS-CoV-2 RNA is  generally detectable in upper and lower respiratory specimens during the acute phase of infection. The lowest concentration of SARS-CoV-2 viral copies this assay can detect is 250 copies / mL. A negative result does not preclude SARS-CoV-2 infection and should not be used as the sole basis for treatment or other patient management decisions.  A negative result may occur with improper specimen collection / handling, submission of specimen other than nasopharyngeal swab, presence of viral mutation(s) within the areas targeted by this assay, and inadequate number of viral copies (<250 copies / mL). A negative result must be combined with clinical observations, patient history, and epidemiological information. Fact Sheet for Patients:   BoilerBrush.com.cyhttps://www.fda.gov/media/136312/download Fact Sheet for Healthcare Providers: https://pope.com/https://www.fda.gov/media/136313/download This test is not yet approved or cleared  by the [place] FDA and has been authorized for detection and/or diagnosis of SARS-CoV-2 by FDA under an Emergency Use Authorization (EUA).  This EUA will remain in effect (meaning this test can be used) for the duration of the COVID-19 declaration under Section 564(b)(1) of the Act, 21 U.S.C. section 360bbb-3(b)(1), unless the authorization is terminated or revoked sooner. Performed at [facility], [address] [place], [place] [address]      Radiological Exams on Admission: Dg Chest 2 View  Result Date: 06/21/2019 CLINICAL DATA:  Shortness of breath.  Fever. EXAM: CHEST - 2 VIEW COMPARISON:  None. FINDINGS: The heart size and mediastinal contours are within normal limits. Both lungs are clear. The visualized skeletal structures are unremarkable. IMPRESSION: No active cardiopulmonary disease. Electronically Signed   By: Gerome Samavid  Williams III M.D   On: 06/21/2019 01:15      Assessment/Plan Active Problems:   Abscess    #1 right medial thigh abscess with  uncontrolled diabetes-she has an extensive abscess in the right medial thigh at least 10 cm in size draining purulent material.  Seen by general surgery going to OR today.  NPO. IV fluids.   She got Zosyn in the ER add vancomycin. MRSA PCR. Lactic acid 0.9.  #2 uncontrolled type 2 diabetes complicated  with neuropathy patient quit taking insulin 4 days prior to admission to hospital due to nausea and decreased p.o. intake.  Since she is n.p.o. I will keep her on sliding scale insulin once she is done with the surgery will need to restart home insulin. She is on Levemir 25 to 45 units in the morning and at bedtime.  Metformin 500 mg daily.  Actos 45 mg daily. #3 neuropathy continue Neurontin  #4 hypertension she is on lisinopril 40 mg daily with hydrochlorothiazide 25 mg daily.  Hold HCTZ since she is n.p.o.  She is on IV fluids restart HCTZ when appropriate.  #5 leukocytosis 22.5 secondary to #1.  Continue IV antibiotics.  #6 hyponatremia sodium 127 pseudohyponatremia due to hyper glycemia.  Monitor closely.  Her blood sugar is 423.  Severity of Illness: The appropriate patient status for this patient is INPATIENT. Inpatient status is judged to be reasonable and necessary in order to provide the required intensity of service to ensure the patient's safety. The patient's presenting symptoms, physical exam findings, and initial radiographic and laboratory data in the context of their chronic comorbidities is felt to place them at high risk for further clinical deterioration. Furthermore, it is not anticipated that the patient will be medically stable for discharge from the hospital within 2 midnights of admission. The following factors support the patient status of inpatient.   " The patient's presenting symptoms include right medial thigh abscess draining purulent material. " The worrisome physical exam findings include right medial thigh abscess " The initial radiographic and laboratory data are  worrisome because of severe leukocytosis hyponatremia hyperglycemia " The chronic co-morbidities include uncontrolled diabetes, hypertension obesity   * I certify that at the point of admission it is my clinical judgment that the patient will require inpatient hospital care spanning beyond 2 midnights from the point of admission due to high intensity of service, high risk for further deterioration and high frequency of surveillance required.*   There is no height or weight on file to calculate BMI.   DVT prophylaxis: Lovenox  code Status: Full code Family Communication: Discussed with patient who is awake and alert  disposition Plan: Pending clinical improvement Consults called: General surgery Admission status: Inpatient   Georgette Shell MD [facility]  If 7PM-7AM, please contact night-coverage [email or web address] Password [email or web address]  06/21/2019, 8:55 AM

## 2019-06-21 NOTE — Progress Notes (Signed)
Pharmacy Antibiotic Note  Amber Jensen is a 38 y.o. female admitted on 06/21/2019 with abscess R medial thigh.  Pharmacy has been consulted for vancomycin dosing.  Plan: LMWH 60 q24 for VTE px for BMI 47 DC Biotin per herbal med policy Vancomycin 4008 mg IV loading dose Vancomycin 1000 mg IV Q 12 hrs. Goal AUC 400-550. Expected AUC: 497.5 SCr used: 1.04 Vd 0.5  Wt 133.4 kg F/u renal function, WBC, temp, culture data Vancomycin levels as needed     Temp (24hrs), Avg:99.9 F (37.7 C), Min:99.4 F (37.4 C), Max:100.3 F (37.9 C)  Recent Labs  Lab 06/21/19 0149 06/21/19 0455 06/21/19 0658  WBC 22.5*  --   --   CREATININE 1.04*  --   --   LATICACIDVEN  --  0.9 0.9    CrCl cannot be calculated (Unknown ideal weight.).    Allergies  Allergen Reactions  . Percocet [Oxycodone-Acetaminophen] Other (See Comments)    Almost passed out   . Vicodin [Hydrocodone-Acetaminophen] Other (See Comments)    Almost passed out     Antimicrobials this admission: 12/5 Zosyn x 1 12/5 Vanc>> Dose adjustments this admission:  Microbiology results: 12/5 Flu A/B neg 12/5 HIV: 12/5 MRSA PCR: 12/5 BCx2: sent 12/5 UCx: sent  Thank you for allowing pharmacy to be a part of this patient's care.  Eudelia Bunch, Pharm.D [phone] 06/21/2019 9:49 AM

## 2019-06-21 NOTE — ED Provider Notes (Signed)
[facility] [facility] DEPT Provider Note   CSN: [id number] Arrival date & time: 06/21/19  0022     History   Chief Complaint Chief Complaint  Patient presents with  . flu-like symptoms    HPI Amber Jensen is a 38 y.o. female.     Patient to ED with symptoms of significant fatigue, body aches, lightheadedness, chills and nausea without vomiting. No diarrhea. She started having symptoms 5 days ago. She reports she is unable to eat but has been drinking lots of water. She states she stopped taking her insulin 4 days ago because of her decreased appetite despite checking her CBG's and finding them increasing everyday. She reports low grade fever.   The history is provided by the patient. A language interpreter was used.    Past Medical History:  Diagnosis Date  . Diabetes mellitus without complication ([facility])   . Hyperlipidemia   . Hypertension     There are no active problems to display for this patient.   History reviewed. No pertinent surgical history.   OB History   No obstetric history on file.      Home Medications    Prior to Admission medications   Medication Sig Start Date End Date Taking? Authorizing Provider  Ascorbic Acid (VITAMIN C) 1000 MG tablet Take 1,000 mg by mouth 3 (three) times daily.   Yes [provider]  Biotin 1000 MCG tablet Take 1,000 mcg by mouth daily.   Yes [provider]  gabapentin (NEURONTIN) 100 MG capsule Take 100 mg by mouth 3 (three) times daily as needed (pain).   Yes [provider]  hydrochlorothiazide (HYDRODIURIL) 25 MG tablet Take 25 mg by mouth daily.   Yes [provider]  ibuprofen (ADVIL) 200 MG tablet Take 200 mg by mouth every 6 (six) hours as needed for moderate pain.   Yes [provider]  insulin detemir (LEVEMIR) 100 UNIT/ML injection Inject 25-45 Units into the skin See admin instructions. 25 units in the am and 45 units at bedtime   Yes [provider]  lisinopril (ZESTRIL) 40 MG tablet Take 40 mg by mouth daily.   Yes [provider]  metFORMIN (GLUCOPHAGE-XR) 500 MG 24 hr tablet Take 500 mg by mouth daily with breakfast.   Yes [provider]  Multiple Vitamin (MULTIVITAMIN WITH MINERALS) TABS tablet Take 1 tablet by mouth daily.   Yes [provider]  pioglitazone (ACTOS) 45 MG tablet Take 45 mg by mouth daily.   Yes [provider]    Family History No family history on file.  Social History Social History   Tobacco Use  . Smoking status: Not on file  Substance Use Topics  . Alcohol use: Not on file  . Drug use: Not on file     Allergies   Percocet [oxycodone-acetaminophen] and Vicodin [hydrocodone-acetaminophen]   Review of Systems Review of Systems  Constitutional: Positive for appetite change, chills, fatigue and fever.  HENT: Negative.  Negative for congestion and sore throat.   Respiratory: Negative.  Negative for cough and shortness of breath.   Cardiovascular: Negative.   Gastrointestinal: Positive for nausea. Negative for abdominal pain and vomiting.  Endocrine: Positive for polydipsia and polyuria.  Genitourinary: Positive for frequency. Negative for dysuria.  Musculoskeletal: Positive for myalgias.  Skin: Negative.   Neurological: Positive for weakness.     Physical Exam Updated Vital Signs BP (!) 153/105   Pulse (!) 121   Temp 100.3 F (37.9 C) (  Oral)   Resp 18   LMP 06/06/2019   SpO2 100%   Physical Exam Vitals signs and nursing note reviewed.  Constitutional:      Appearance: She is well-developed.  HENT:     Head: Normocephalic.     Mouth/Throat:     Mouth: Mucous membranes are moist.     Pharynx: Oropharynx is clear.  Neck:     Musculoskeletal: Normal range of motion and neck supple.  Cardiovascular:     Rate and Rhythm: Regular rhythm. Tachycardia present.     Heart sounds: No murmur.  Pulmonary:     Effort: Pulmonary effort is  normal.     Breath sounds: Normal breath sounds. No wheezing, rhonchi or rales.  Abdominal:     General: Bowel sounds are normal.     Palpations: Abdomen is soft.     Tenderness: There is no abdominal tenderness. There is no guarding or rebound.  Musculoskeletal: Normal range of motion.  Skin:    General: Skin is warm and dry.  Neurological:     Mental Status: She is alert and oriented to person, place, and time.      ED Treatments / Results  Labs (all labs ordered are listed, but only abnormal results are displayed) Labs Reviewed  CBC WITH DIFFERENTIAL/PLATELET - Abnormal; Notable for the following components:      Result Value   WBC 22.5 (*)    Hemoglobin 11.2 (*)    HCT 34.7 (*)    MCV 79.0 (*)    MCH 25.5 (*)    Neutro Abs 17.7 (*)    Monocytes Absolute 2.3 (*)    Abs Immature Granulocytes 0.43 (*)    All other components within normal limits  COMPREHENSIVE METABOLIC PANEL - Abnormal; Notable for the following components:   Sodium 127 (*)    Chloride 95 (*)    CO2 19 (*)    Glucose, Bld 423 (*)    Creatinine, Ser 1.04 (*)    Calcium 8.4 (*)    Albumin 3.1 (*)    AST 14 (*)    All other components within normal limits  SARS CORONAVIRUS 2 (TAT 6-24 HRS)  INFLUENZA PANEL BY PCR (TYPE A & B)    EKG None  Radiology Dg Chest 2 View  Result Date: 06/21/2019 CLINICAL DATA:  Shortness of breath.  Fever. EXAM: CHEST - 2 VIEW COMPARISON:  None. FINDINGS: The heart size and mediastinal contours are within normal limits. Both lungs are clear. The visualized skeletal structures are unremarkable. IMPRESSION: No active cardiopulmonary disease. Electronically Signed   By: Gerome Sam III M.D   On: 06/21/2019 01:15    Procedures Procedures (including critical care time)  Medications Ordered in ED Medications  sodium chloride 0.9 % bolus 1,000 mL (1,000 mLs Intravenous New Bag/Given 06/21/19 0250)     Initial Impression / Assessment and Plan / ED Course  I have  reviewed the triage vital signs and the nursing notes.  Pertinent labs & imaging results that were available during my care of the patient were reviewed by me and considered in my medical decision making (see chart for details).        Patient to ED with 5 days of symptoms as detailed in the HPI.   She is markedly tachycardic to 134. IVF"s started by bolus. Overall, she is well appearing, nontoxic. Anticipate hyperglycemia, will r/o DKA and correct sugar. Will screen for infection - labs, CXR pending.   During the process of  evaluation she reports she has a boil on the right inner thigh that has been opened and draining over the last several days. On exam, there is a very large area of induration extending from front of thigh to back and had a width of approximately 8-10 cm. There is a small area of draining purulent material at medial thigh. There are cellulitic changes extending to the distal anteromedial thigh. Feel this is the source of her leukocytosis of 22, temperature, tachycardia. Blood cultures, lactic acid added to lab set. Zosyn ordered, 3.375 mg IV  5:35 - Lactic acid normal at 0.9. Feel the abscess is large enough to warrant surgical intervention. Dr. Cliffton AstersWhite, gen surg, was consulted. He will see the patient in the ED. He requests rapid COVID in anticipation of surgery. Patient is made NPO at this time. Will admit to Hospitalist service.  Final Clinical Impressions(s) / ED Diagnoses   Final diagnoses:  None   1. Abscess, right thigh 2. Cellulitis 3. Hyperglycemia  ED Discharge Orders    None       Elpidio AnisUpstill, Jerica Creegan, PA-C 06/21/19 0730    Palumbo, April, MD 06/21/19 [id number]

## 2019-06-21 NOTE — Anesthesia Postprocedure Evaluation (Signed)
Anesthesia Post Note  Patient: Amber Jensen  Procedure(s) Performed: INCISION AND DRAINAGE ABSCESS groin/thigh (N/A Thigh)     Patient location during evaluation: PACU Anesthesia Type: General Level of consciousness: awake and alert Pain management: pain level controlled Vital Signs Assessment: post-procedure vital signs reviewed and stable Respiratory status: spontaneous breathing, nonlabored ventilation and respiratory function stable Cardiovascular status: blood pressure returned to baseline and stable Postop Assessment: no apparent nausea or vomiting Anesthetic complications: no    Last Vitals:  Vitals:   06/21/19 1710 06/21/19 2150  BP: (!) 150/84 (!) 169/93  Pulse: (!) 108 96  Resp:  16  Temp:  37.2 C  SpO2: 99% 99%    Last Pain:  Vitals:   06/21/19 2150  TempSrc: Oral  PainSc:                  Lidia Collum

## 2019-06-22 ENCOUNTER — Encounter (HOSPITAL_COMMUNITY): Payer: Self-pay | Admitting: Surgery

## 2019-06-22 ENCOUNTER — Inpatient Hospital Stay (HOSPITAL_COMMUNITY): Payer: 59

## 2019-06-22 DIAGNOSIS — N179 Acute kidney failure, unspecified: Secondary | ICD-10-CM

## 2019-06-22 LAB — CBC
HCT: 34.7 % — ABNORMAL LOW (ref 36.0–46.0)
Hemoglobin: 10.8 g/dL — ABNORMAL LOW (ref 12.0–15.0)
MCH: 25.7 pg — ABNORMAL LOW (ref 26.0–34.0)
MCHC: 31.1 g/dL (ref 30.0–36.0)
MCV: 82.4 fL (ref 80.0–100.0)
Platelets: 317 10*3/uL (ref 150–400)
RBC: 4.21 MIL/uL (ref 3.87–5.11)
RDW: 13 % (ref 11.5–15.5)
WBC: 25.1 10*3/uL — ABNORMAL HIGH (ref 4.0–10.5)
nRBC: 0 % (ref 0.0–0.2)

## 2019-06-22 LAB — CREATININE, URINE, RANDOM: Creatinine, Urine: 76.07 mg/dL

## 2019-06-22 LAB — GLUCOSE, CAPILLARY
Glucose-Capillary: 386 mg/dL — ABNORMAL HIGH (ref 70–99)
Glucose-Capillary: 495 mg/dL — ABNORMAL HIGH (ref 70–99)
Glucose-Capillary: 499 mg/dL — ABNORMAL HIGH (ref 70–99)
Glucose-Capillary: 417 mg/dL — ABNORMAL HIGH (ref 70–99)
Glucose-Capillary: 430 mg/dL — ABNORMAL HIGH (ref 70–99)
Glucose-Capillary: 424 mg/dL — ABNORMAL HIGH (ref 70–99)
Glucose-Capillary: 458 mg/dL — ABNORMAL HIGH (ref 70–99)
Glucose-Capillary: 392 mg/dL — ABNORMAL HIGH (ref 70–99)
Glucose-Capillary: 432 mg/dL — ABNORMAL HIGH (ref 70–99)
Glucose-Capillary: 319 mg/dL — ABNORMAL HIGH (ref 70–99)

## 2019-06-22 LAB — COMPREHENSIVE METABOLIC PANEL
ALT: 9 U/L (ref 0–44)
AST: 15 U/L (ref 15–41)
Albumin: 2.6 g/dL — ABNORMAL LOW (ref 3.5–5.0)
Alkaline Phosphatase: 95 U/L (ref 38–126)
Anion gap: 12 (ref 5–15)
BUN: 27 mg/dL — ABNORMAL HIGH (ref 6–20)
CO2: 17 mmol/L — ABNORMAL LOW (ref 22–32)
Calcium: 8 mg/dL — ABNORMAL LOW (ref 8.9–10.3)
Chloride: 102 mmol/L (ref 98–111)
Creatinine, Ser: 2.1 mg/dL — ABNORMAL HIGH (ref 0.44–1.00)
GFR calc Af Amer: 33 mL/min — ABNORMAL LOW (ref >60)
GFR calc non Af Amer: 29 mL/min — ABNORMAL LOW (ref >60)
Glucose, Bld: 403 mg/dL — ABNORMAL HIGH (ref 70–99)
Potassium: 4.5 mmol/L (ref 3.5–5.1)
Sodium: 131 mmol/L — ABNORMAL LOW (ref 135–145)
Total Bilirubin: 0.9 mg/dL (ref 0.3–1.2)
Total Protein: 6.8 g/dL (ref 6.5–8.1)

## 2019-06-22 LAB — BASIC METABOLIC PANEL
Anion gap: 11 (ref 5–15)
BUN: 38 mg/dL — ABNORMAL HIGH (ref 6–20)
CO2: 18 mmol/L — ABNORMAL LOW (ref 22–32)
Calcium: 8.1 mg/dL — ABNORMAL LOW (ref 8.9–10.3)
Chloride: 101 mmol/L (ref 98–111)
Creatinine, Ser: 2.46 mg/dL — ABNORMAL HIGH (ref 0.44–1.00)
GFR calc Af Amer: 27 mL/min — ABNORMAL LOW (ref >60)
GFR calc non Af Amer: 24 mL/min — ABNORMAL LOW (ref >60)
Glucose, Bld: 519 mg/dL (ref 70–99)
Potassium: 4.2 mmol/L (ref 3.5–5.1)
Sodium: 130 mmol/L — ABNORMAL LOW (ref 135–145)

## 2019-06-22 LAB — HEMOGLOBIN A1C
Hgb A1c MFr Bld: 11.9 % — ABNORMAL HIGH (ref 4.8–5.6)
Mean Plasma Glucose: 294.83 mg/dL

## 2019-06-22 LAB — URINE CULTURE

## 2019-06-22 LAB — VANCOMYCIN, TROUGH: Vancomycin Tr: 29 ug/mL (ref 15–20)

## 2019-06-22 LAB — SODIUM, URINE, RANDOM: Sodium, Ur: 14 mmol/L

## 2019-06-22 LAB — GLUCOSE, RANDOM: Glucose, Bld: 539 mg/dL (ref 70–99)

## 2019-06-22 MED ORDER — INSULIN ASPART 100 UNIT/ML ~~LOC~~ SOLN
3.0000 [IU] | Freq: Three times a day (TID) | SUBCUTANEOUS | Status: DC
  Administered 2019-06-22 (×2): 3 [IU] via SUBCUTANEOUS

## 2019-06-22 MED ORDER — INSULIN DETEMIR 100 UNIT/ML ~~LOC~~ SOLN
40.0000 [IU] | Freq: Every day | SUBCUTANEOUS | Status: DC
  Administered 2019-06-22: 18:00:00 40 [IU] via SUBCUTANEOUS
  Filled 2019-06-22 (×2): qty 0.4

## 2019-06-22 MED ORDER — INSULIN DETEMIR 100 UNIT/ML ~~LOC~~ SOLN
50.0000 [IU] | Freq: Every day | SUBCUTANEOUS | Status: DC
  Administered 2019-06-23: 21:00:00 50 [IU] via SUBCUTANEOUS
  Filled 2019-06-22: qty 0.5

## 2019-06-22 MED ORDER — INSULIN ASPART 100 UNIT/ML ~~LOC~~ SOLN
0.0000 [IU] | Freq: Three times a day (TID) | SUBCUTANEOUS | Status: DC
  Administered 2019-06-22: 20 [IU] via SUBCUTANEOUS
  Administered 2019-06-23 (×2): 3 [IU] via SUBCUTANEOUS

## 2019-06-22 MED ORDER — INSULIN DETEMIR 100 UNIT/ML ~~LOC~~ SOLN
25.0000 [IU] | Freq: Every day | SUBCUTANEOUS | Status: DC
  Administered 2019-06-22: 25 [IU] via SUBCUTANEOUS
  Filled 2019-06-22: qty 0.25

## 2019-06-22 MED ORDER — INSULIN ASPART 100 UNIT/ML ~~LOC~~ SOLN
12.0000 [IU] | Freq: Once | SUBCUTANEOUS | Status: AC
  Administered 2019-06-22: 20:00:00 12 [IU] via SUBCUTANEOUS

## 2019-06-22 MED ORDER — INSULIN DETEMIR 100 UNIT/ML ~~LOC~~ SOLN
30.0000 [IU] | Freq: Every day | SUBCUTANEOUS | Status: DC
  Administered 2019-06-23 – 2019-06-24 (×2): 30 [IU] via SUBCUTANEOUS
  Filled 2019-06-22 (×2): qty 0.3

## 2019-06-22 MED ORDER — INSULIN ASPART 100 UNIT/ML ~~LOC~~ SOLN
0.0000 [IU] | Freq: Every day | SUBCUTANEOUS | Status: DC
  Administered 2019-06-22: 23:00:00 4 [IU] via SUBCUTANEOUS
  Administered 2019-06-28: 3 [IU] via SUBCUTANEOUS

## 2019-06-22 MED ORDER — INSULIN ASPART 100 UNIT/ML ~~LOC~~ SOLN
5.0000 [IU] | Freq: Three times a day (TID) | SUBCUTANEOUS | Status: DC
  Administered 2019-06-23 (×3): 5 [IU] via SUBCUTANEOUS

## 2019-06-22 MED ORDER — VANCOMYCIN VARIABLE DOSE PER UNSTABLE RENAL FUNCTION (PHARMACIST DOSING): Status: DC

## 2019-06-22 NOTE — Progress Notes (Signed)
Dr. Karleen Hampshire notified of pt blood glucose of 495. Pt given scheduled insulin and md will monitor.

## 2019-06-22 NOTE — Progress Notes (Signed)
Critical lab of Vancomycin level of 29 messaged to Dr. Karleen Hampshire, awaiting a call back. Pt remains stable.

## 2019-06-22 NOTE — Progress Notes (Signed)
PROGRESS NOTE    Amber Jensen  ZOX:[id number] DOB: 09/12/1980 DOA: 06/21/2019 PCP: System, Pcp Not In    Brief Narrative:   38 year old with prior h/o uncontrolled DM, hypertension, admitted for right upper medial thigh swelling, was found to have thigh abscess underwent I&D by general surgery and is currently on broad spectrum IV antibiotics.    Assessment & Plan:   Active Problems:   Abscess  Right thigh abscess S/p incision and drainage by general surgery on 12/ 5/20. Continue with IV vancomycin.  Follow blood cultures and cultures from the thigh. Pain control.   Uncontrolled diabetes mellitus with hyperglycemia Restarted the home Levemir today, 25 units in the morning and 40 units at night. Continue with the assistant sliding scale insulin.  Repeat BMP to check anion gap today. CBG (last 3)  Recent Labs    06/22/19 1509 06/22/19 1657 06/22/19 1658  GLUCAP 424* 509* 458*   A1c is 11.9   Essential hypertension  Well-controlled blood pressure parameters.   AKI Differential include ATN from the abscess versus dehydration. Renal ultrasound is negative for hydronephrosis Urine studies ordered.  Repeat BMP in the morning.   Anemia of chronic disease Get anemia panel.    Leukocytosis Probably from the abscess.    Hyponatremia Probably secondary to hyperglycemia Recheck sodium in the morning.  DVT prophylaxis: Lovenox Code Status: FULL CODE.  Family Communication: None at bedside.  Disposition Plan: pending clinical improvement.    Consultants:   General surgery  Procedures: S/p incision and drainage by general surgery on 06/21/2019 Antimicrobials: IV vancomycin. Subjective: No chest pain or shortness of breath, pain in the thigh is better controlled.  Objective: Vitals:   06/21/19 1710 06/21/19 2150 06/22/19 0456 06/22/19 1431  BP: (!) 150/84 (!) 169/93 (!) 141/90 130/89  Pulse: (!) 108 96 79 97  Resp:  16 16 17   Temp:  99 F (37.2 C)  97.7 F (36.5 C) (!) 97 F (36.1 C)  TempSrc:  Oral Oral   SpO2: 99% 99% 99% 100%  Weight:      Height:        Intake/Output Summary (Last 24 hours) at 06/22/2019 1723 Last data filed at 06/22/2019 1515 Gross per 24 hour  Intake 3218.85 ml  Output 900 ml  Net 2318.85 ml   Filed Weights   06/21/19 0925  Weight: 133.4 kg    Examination:  General exam: Appears calm and comfortable  Respiratory system: Clear to auscultation. Respiratory effort normal. Cardiovascular system: S1 & S2 heard, RRR. Marland Kitchen. No pedal edema. Gastrointestinal system: Abdomen is nondistended, soft and nontender. . Normal bowel sounds heard. Central nervous system: Alert and oriented. No focal neurological deficits. Extremities: right thigh bandaged.  Skin: No rashes, lesions or ulcers Psychiatry:. Mood & affect appropriate.     Data Reviewed: I have personally reviewed following labs and imaging studies  CBC: Recent Labs  Lab 06/21/19 0149 06/22/19 0300  WBC 22.5* 25.1*  NEUTROABS 17.7*  --   HGB 11.2* 10.8*  HCT 34.7* 34.7*  MCV 79.0* 82.4  PLT 311 317   Basic Metabolic Panel: Recent Labs  Lab 06/21/19 0149 06/22/19 0300 06/22/19 1226  NA 127* 131*  --   K 3.6 4.5  --   CL 95* 102  --   CO2 19* 17*  --   GLUCOSE 423* 403* 539*  BUN 16 27*  --   CREATININE 1.04* 2.10*  --   CALCIUM 8.4* 8.0*  --    GFR:  Estimated Creatinine Clearance: 50 mL/min (A) (by C-G formula based on SCr of 2.1 mg/dL (H)). Liver Function Tests: Recent Labs  Lab 06/21/19 0149 06/22/19 0300  AST 14* 15  ALT 13 9  ALKPHOS 107 95  BILITOT 1.0 0.9  PROT 7.6 6.8  ALBUMIN 3.1* 2.6*   No results for input(s): LIPASE, AMYLASE in the last 168 hours. No results for input(s): AMMONIA in the last 168 hours. Coagulation Profile: No results for input(s): INR, PROTIME in the last 168 hours. Cardiac Enzymes: No results for input(s): CKTOTAL, CKMB, CKMBINDEX, TROPONINI in the last 168 hours. BNP (last 3 results) No  results for input(s): PROBNP in the last 8760 hours. HbA1C: Recent Labs    06/22/19 0300  HGBA1C 11.9*   CBG: Recent Labs  Lab 06/22/19 1313 06/22/19 1315 06/22/19 1509 06/22/19 1657 06/22/19 1658  GLUCAP 430* 417* 424* 509* 458*   Lipid Profile: No results for input(s): CHOL, HDL, LDLCALC, TRIG, CHOLHDL, LDLDIRECT in the last 72 hours. Thyroid Function Tests: No results for input(s): TSH, T4TOTAL, FREET4, T3FREE, THYROIDAB in the last 72 hours. Anemia Panel: No results for input(s): VITAMINB12, FOLATE, FERRITIN, TIBC, IRON, RETICCTPCT in the last 72 hours. Sepsis Labs: Recent Labs  Lab 06/21/19 0455 06/21/19 0658  LATICACIDVEN 0.9 0.9    Recent Results (from the past 240 hour(s))  SARS CORONAVIRUS 2 (TAT 6-24 HRS) Nasopharyngeal Nasopharyngeal Swab     Status: None   Collection Time: 06/21/19  1:50 AM   Specimen: Nasopharyngeal Swab  Result Value Ref Range Status   SARS Coronavirus 2 NEGATIVE NEGATIVE Final    Comment: (NOTE) SARS-CoV-2 target nucleic acids are NOT DETECTED. The SARS-CoV-2 RNA is generally detectable in upper and lower respiratory specimens during the acute phase of infection. Negative results do not preclude SARS-CoV-2 infection, do not rule out co-infections with other pathogens, and should not be used as the sole basis for treatment or other patient management decisions. Negative results must be combined with clinical observations, patient history, and epidemiological information. The expected result is Negative. Fact Sheet for Patients: SugarRoll.be Fact Sheet for Healthcare Providers: https://www.woods-mathews.com/ This test is not yet approved or cleared by the [place] FDA and  has been authorized for detection and/or diagnosis of SARS-CoV-2 by FDA under an Emergency Use Authorization (EUA). This EUA will remain  in effect (meaning this test can be used) for the duration of the COVID-19  declaration under Section 56 4(b)(1) of the Act, 21 U.S.C. section 360bbb-3(b)(1), unless the authorization is terminated or revoked sooner. Performed at [facility], [place] [address] [place], [place] [address]   Urine culture     Status: Abnormal   Collection Time: 06/21/19  4:21 AM   Specimen: Urine, Clean Catch  Result Value Ref Range Status   Specimen Description   Final    URINE, CLEAN CATCH Performed at [facility], [place] [address], [place], [place] [address]    Special Requests   Final    NONE Performed at [facility], [place] [address], [place],  [address]    Culture MULTIPLE SPECIES PRESENT, SUGGEST RECOLLECTION (A)  Final   Report Status 06/22/2019 FINAL  Final  Culture, blood (routine x 2)     Status: None (Preliminary result)   Collection Time: 06/21/19  4:55 AM   Specimen: BLOOD  Result Value Ref Range Status   Specimen Description   Final    BLOOD RIGHT ANTECUBITAL Performed at [facility], [address] [place], [place] [address]    Special Requests   Final    BOTTLES DRAWN AEROBIC AND ANAEROBIC Blood Culture results may not be optimal due to an excessive volume of blood received in culture bottles Performed at [facility], [address] [place], [place] [address]    Culture   Final    NO GROWTH < 24 HOURS Performed at [facility], [address], [place], [place] [address]    Report Status PENDING  Incomplete  Culture, blood (routine x 2)     Status: None (Preliminary result)   Collection Time: 06/21/19  4:55 AM   Specimen: BLOOD LEFT HAND  Result Value Ref Range Status   Specimen Description   Final    BLOOD LEFT HAND Performed at [facility], [address] [place], [place] [address]    Special Requests   Final    BOTTLES DRAWN AEROBIC AND ANAEROBIC Blood Culture adequate volume Performed at [facility], [address] [place], [place] [address]    Culture   Final    NO GROWTH < 24 HOURS Performed at [facility], [address], [place], [place] [address]    Report Status PENDING  Incomplete  SARS Coronavirus 2 by RT PCR (hospital order, performed in [facility] hospital lab) Nasopharyngeal Nasopharyngeal Swab     Status: None   Collection Time: 06/21/19  7:00 AM   Specimen: Nasopharyngeal Swab  Result Value Ref Range Status   SARS Coronavirus 2 NEGATIVE NEGATIVE Final    Comment: (NOTE) SARS-CoV-2 target nucleic acids are NOT DETECTED. The SARS-CoV-2 RNA is generally detectable in upper and lower respiratory specimens during the acute phase of infection. The lowest concentration of SARS-CoV-2 viral copies this assay can detect is 250 copies / mL. A negative result does not preclude SARS-CoV-2 infection and should not be used as the sole basis for treatment or other patient management decisions.  A negative result may occur with improper specimen collection / handling, submission of specimen other than nasopharyngeal swab, presence of viral mutation(s) within the areas targeted by this assay, and inadequate number of viral copies (<250 copies / mL). A negative result must be combined with clinical observations, patient history, and epidemiological information. Fact Sheet for Patients:   BoilerBrush.com.cy Fact Sheet for Healthcare Providers: https://pope.com/ This test is not yet approved or cleared  by the [place] FDA and has been authorized for detection and/or diagnosis of SARS-CoV-2 by FDA under an Emergency Use Authorization (EUA).  This EUA will remain in effect (meaning this test can be used) for the duration of the COVID-19 declaration under Section 564(b)(1) of the Act, 21 U.S.C. section 360bbb-3(b)(1), unless the authorization is terminated or revoked sooner. Performed at [facility], [address] [place], [place] [address]   Aerobic/Anaerobic Culture (surgical/deep wound)     Status: None (Preliminary result)   Collection Time: 06/21/19  1:42 PM   Specimen: Abscess  Result Value Ref Range Status   Specimen Description   Final    ABSCESS Performed at [facility], [address] [place], [place] [address]    Special Requests   Final    NONE Performed at [facility], [address] [place], [place] [address]    Gram Stain   Final    RARE WBC PRESENT, PREDOMINANTLY PMN MODERATE GRAM POSITIVE COCCI MODERATE GRAM NEGATIVE RODS FEW GRAM POSITIVE RODS  Culture   Final    MODERATE STAPHYLOCOCCUS AUREUS SUSCEPTIBILITIES TO FOLLOW Performed at [facility], [address] [place], [place] [address]    Report Status PENDING  Incomplete         Radiology Studies: Dg Chest 2 View  Result Date: 06/21/2019 CLINICAL DATA:  Shortness of breath.  Fever. EXAM: CHEST - 2 VIEW COMPARISON:  None. FINDINGS: The heart size and mediastinal contours are within normal limits. Both lungs are clear. The visualized skeletal structures are unremarkable. IMPRESSION: No active cardiopulmonary disease. Electronically Signed   By: Gerome Sam III M.D   On: 06/21/2019 01:15   US Renal  Result Date: 06/22/2019 CLINICAL DATA:  Acute kidney injury. EXAM: RENAL / URINARY TRACT ULTRASOUND COMPLETE COMPARISON:  None. FINDINGS: Right Kidney: Renal measurements: 11.9 x 5.1 x 5.6 cm. = volume: 177.3 mL . Echogenicity within normal limits. No mass or hydronephrosis visualized. Left Kidney: Renal measurements: 13.3 by 5.5 x 5.5 cm = volume: 212.7 mL. Echogenicity within normal limits. No mass or hydronephrosis visualized. Bladder: Appears normal for degree of bladder distention. Other: None. IMPRESSION: 1. Normal renal sonogram. 2. No acute findings identified. Electronically Signed   By: Signa Kell M.D.   On: 06/22/2019 15:32         Scheduled Meds: . enoxaparin (LOVENOX) injection  60 mg Subcutaneous Q24H  . insulin aspart  0-20 Units Subcutaneous TID WC  . insulin aspart  0-5 Units Subcutaneous QHS  . insulin aspart  3 Units Subcutaneous TID WC  . insulin detemir  25 Units Subcutaneous Daily  . insulin detemir  40 Units Subcutaneous QHS  . vancomycin variable dose per unstable renal function (pharmacist dosing)   Does not apply See admin instructions  . vitamin C  1,000 mg Oral Daily   Continuous Infusions: . sodium chloride 75 mL/hr at 06/22/19 1659     LOS: 1 day        Kathlen Mody, MD [facility]  06/22/2019, 5:23 PM

## 2019-06-22 NOTE — Progress Notes (Signed)
CRITICAL VALUE ALERT  Critical Value: Blood glucose 539  Date & Time Notied:  06-22-2019 1314  Provider Notified: Dr. Karleen Hampshire  Orders Received/Actions taken: Check blood glucose now and then at 1500. Then at 1700 for q4h check. Md will readdress then. No more insulin to be given at this time.

## 2019-06-22 NOTE — Progress Notes (Signed)
md aware of high vancomycin level.

## 2019-06-22 NOTE — Progress Notes (Signed)
Pharmacy Antibiotic Note  Amber Jensen is a 38 y.o. female admitted on 06/21/2019 with abscess R medial thigh.  Pharmacy has been consulted for vancomycin dosing.  Patient admitted with right thigh abscess, underwent I&D on 12/5.   Today, 06/22/19  Day #2 IV antibiotics, on vancomycin 1000 mg IV q12h  WBC 25.1 - remains elevated  Tmax 100.7 F  SCr doubled in the past 24 hours from 1.04 --> 2.10. Pt has received 2 doses of vancomycin. Received 1 dose of piperacillin/tazobactam on 12/5, no other nephrotoxic medications noted.  Vancomycin random = 29 mcg/mL. Vancomycin not at steady state, level obtained ~10.5 hours after last vancomycin dose.  Plan:  Discontinue scheduled vancomycin dosing at this time given increase in SCr. Vancomycin variable dosing order entered.   Will resume vancomycin dosing once VR < 15-20 mcg/mL, will need to dose off of random levels until SCr stabilizes.  Height: 5\' 6"  (167.6 cm) Weight: 294 lb (133.4 kg) IBW/kg (Calculated) : 59.3  Temp (24hrs), Avg:98.7 F (37.1 C), Min:97.7 F (36.5 C), Max:99.4 F (37.4 C)  Recent Labs  Lab 06/21/19 0149 06/21/19 0455 06/21/19 0658 06/22/19 0300 06/22/19 0941  WBC 22.5*  --   --  25.1*  --   CREATININE 1.04*  --   --  2.10*  --   LATICACIDVEN  --  0.9 0.9  --   --   VANCOTROUGH  --   --   --   --  29*    Estimated Creatinine Clearance: 50 mL/min (A) (by C-G formula based on SCr of 2.1 mg/dL (H)).    Allergies  Allergen Reactions  . Percocet [Oxycodone-Acetaminophen] Other (See Comments)    Almost passed out   . Vicodin [Hydrocodone-Acetaminophen] Other (See Comments)    Almost passed out     Antimicrobials this admission: 12/5 Zosyn x 1 12/5 Vanc>>  Dose adjustments this admission:  Microbiology results: 12/5 Flu A/B neg 12/5 MRSA PCR: Sent 12/5 BCx2: ngtd 12/5 UCx: multiple species, suggest recollection 12/5 SARS-2: negative 12/5 Wound: moderate GPC, moderate GNR, few GPR;  pending  Thank you for allowing pharmacy to be a part of this patient's care.  Lenis Noon, PharmD 06/22/19 11:03 AM

## 2019-06-22 NOTE — Plan of Care (Signed)
  Problem: Clinical Measurements: Goal: Diagnostic test results will improve Outcome: Progressing   Problem: Clinical Measurements: Goal: Respiratory complications will improve Outcome: Progressing   Problem: Clinical Measurements: Goal: Cardiovascular complication will be avoided Outcome: Progressing   Problem: Activity: Goal: Risk for activity intolerance will decrease Outcome: Progressing   

## 2019-06-22 NOTE — Progress Notes (Addendum)
Subjective No acute events. Thigh let sore than on admission.  Objective: Vital signs in last 24 hours: Temp:  [97.7 F (36.5 C)-100.7 F (38.2 C)] 97.7 F (36.5 C) (12/06 0456) Pulse Rate:  [79-127] 79 (12/06 0456) Resp:  [16-33] 16 (12/06 0456) BP: (136-169)/(72-93) 141/90 (12/06 0456) SpO2:  [96 %-100 %] 99 % (12/06 0456) Weight:  [133.4 kg] 133.4 kg (12/05 0925) Last BM Date: 06/17/19  Intake/Output from previous day: 12/05 0701 - 12/06 0700 In: 2087.7 [P.O.:240; I.V.:1147.7; IV Piggyback:700] Out: 600 [Urine:600] Intake/Output this shift: Total I/O In: 342.6 [P.O.:240; I.V.:102.6] Out: -   Gen: NAD, comfortable CV: RRR Pulm: Normal work of breathing Ext: SCDs in place; right medial thigh kerlex packing removed; wound base is clean without purulent drainage - no large fat necrosis or gangrenous tissue visualized; erythema/skin changes regression.   Examination completed with tech from floor as chaperone  Lab Results: CBC  Recent Labs    06/21/19 0149 06/22/19 0300  WBC 22.5* 25.1*  HGB 11.2* 10.8*  HCT 34.7* 34.7*  PLT 311 317   BMET Recent Labs    06/21/19 0149 06/22/19 0300  NA 127* 131*  K 3.6 4.5  CL 95* 102  CO2 19* 17*  GLUCOSE 423* 403*  BUN 16 27*  CREATININE 1.04* 2.10*  CALCIUM 8.4* 8.0*   PT/INR No results for input(s): LABPROT, INR in the last 72 hours. ABG No results for input(s): PHART, HCO3 in the last 72 hours.  Invalid input(s): PCO2, PO2  Studies/Results:  Anti-infectives: Anti-infectives (From admission, onward)   Start     Dose/Rate Route Frequency Ordered Stop   06/21/19 2200  vancomycin (VANCOCIN) IVPB 1000 mg/200 mL premix     1,000 mg 200 mL/hr over 60 Minutes Intravenous Every 12 hours 06/21/19 1142     06/21/19 1000  vancomycin (VANCOCIN) 2,500 mg in sodium chloride 0.9 % 500 mL IVPB     2,500 mg 250 mL/hr over 120 Minutes Intravenous  Once 06/21/19 0929 06/21/19 1245   06/21/19 0430  piperacillin-tazobactam  (ZOSYN) IVPB 3.375 g     3.375 g 100 mL/hr over 30 Minutes Intravenous  Once 06/21/19 0427 06/21/19 0532       Assessment/Plan: Patient Active Problem List   Diagnosis Date Noted  . Abscess 06/21/2019  . Cellulitis of right lower extremity   . Hyperglycemia    s/p Procedure(s): INCISION AND DRAINAGE ABSCESS groin/thigh 06/21/2019  -Wet to dry dressing to be changed twice daily at this time - moist kerlex to base of wound, cover with ABD pad; change as needed as well -Cont IV abx today until all cellulitic changes resolve and wbc down-trending -Wound care teaching -Would likely plan for home health consult -Noted Cr bump to 2.10; UOP 600 cc overnight - monitor; IV abx/dose of vanc as per primary and pharmacy given this as well -Glycemic control; no concentrated sweets   LOS: 1 day   Sharon Mt. Dema Severin, M.D. [facility] Use [email or web address] to contact on call provider

## 2019-06-22 NOTE — Progress Notes (Signed)
Dr. Karleen Hampshire notified of pt blood sugars of 508 and 458. Md wanted pt to get Levimir early. This dose was requested from the pharmacy. Md to draw labs. Rn will continue to monitor.

## 2019-06-22 NOTE — Progress Notes (Addendum)
CRITICAL VALUE ALERT  Critical Value: BG 519     Unit BG was 397  Date & Time Notied: 12/6  1936  Provider Notified: TR[facility]  Orders Received/Actions taken: Awaiting orders  Orders for 12 units of insulin.  Given

## 2019-06-23 LAB — BASIC METABOLIC PANEL
Anion gap: 13 (ref 5–15)
BUN: 40 mg/dL — ABNORMAL HIGH (ref 6–20)
CO2: 16 mmol/L — ABNORMAL LOW (ref 22–32)
Calcium: 8.4 mg/dL — ABNORMAL LOW (ref 8.9–10.3)
Chloride: 105 mmol/L (ref 98–111)
Creatinine, Ser: 2.19 mg/dL — ABNORMAL HIGH (ref 0.44–1.00)
GFR calc Af Amer: 32 mL/min — ABNORMAL LOW (ref >60)
GFR calc non Af Amer: 27 mL/min — ABNORMAL LOW (ref >60)
Glucose, Bld: 173 mg/dL — ABNORMAL HIGH (ref 70–99)
Potassium: 3.6 mmol/L (ref 3.5–5.1)
Sodium: 134 mmol/L — ABNORMAL LOW (ref 135–145)

## 2019-06-23 LAB — CBC
HCT: 37.2 % (ref 36.0–46.0)
Hemoglobin: 11.5 g/dL — ABNORMAL LOW (ref 12.0–15.0)
MCH: 25.4 pg — ABNORMAL LOW (ref 26.0–34.0)
MCHC: 30.9 g/dL (ref 30.0–36.0)
MCV: 82.3 fL (ref 80.0–100.0)
Platelets: 359 10*3/uL (ref 150–400)
RBC: 4.52 MIL/uL (ref 3.87–5.11)
RDW: 13.2 % (ref 11.5–15.5)
WBC: 21.9 10*3/uL — ABNORMAL HIGH (ref 4.0–10.5)
nRBC: 0 % (ref 0.0–0.2)

## 2019-06-23 LAB — RETICULOCYTES
Immature Retic Fract: 25.9 % — ABNORMAL HIGH (ref 2.3–15.9)
RBC.: 4.15 MIL/uL (ref 3.87–5.11)
Retic Count, Absolute: 43.2 10*3/uL (ref 19.0–186.0)
Retic Ct Pct: 1 % (ref 0.4–3.1)

## 2019-06-23 LAB — GLUCOSE, CAPILLARY
Glucose-Capillary: 111 mg/dL — ABNORMAL HIGH (ref 70–99)
Glucose-Capillary: 126 mg/dL — ABNORMAL HIGH (ref 70–99)
Glucose-Capillary: 149 mg/dL — ABNORMAL HIGH (ref 70–99)
Glucose-Capillary: 161 mg/dL — ABNORMAL HIGH (ref 70–99)
Glucose-Capillary: 263 mg/dL — ABNORMAL HIGH (ref 70–99)
Glucose-Capillary: 383 mg/dL — ABNORMAL HIGH (ref 70–99)
Glucose-Capillary: 509 mg/dL (ref 70–99)
Glucose-Capillary: 95 mg/dL (ref 70–99)

## 2019-06-23 LAB — IRON AND TIBC
Iron: 21 ug/dL — ABNORMAL LOW (ref 28–170)
Saturation Ratios: 11 % (ref 10.4–31.8)
TIBC: 187 ug/dL — ABNORMAL LOW (ref 250–450)
UIBC: 166 ug/dL

## 2019-06-23 LAB — VITAMIN B12: Vitamin B-12: 1778 pg/mL — ABNORMAL HIGH (ref 180–914)

## 2019-06-23 LAB — FERRITIN: Ferritin: 131 ng/mL (ref 11–307)

## 2019-06-23 LAB — FOLATE: Folate: 8.4 ng/mL (ref >5.9)

## 2019-06-23 LAB — VANCOMYCIN, RANDOM: Vancomycin Rm: 19

## 2019-06-23 MED ORDER — VANCOMYCIN HCL IN DEXTROSE 1-5 GM/200ML-% IV SOLN
1000.0000 mg | Freq: Once | INTRAVENOUS | Status: AC
  Administered 2019-06-23: 1000 mg via INTRAVENOUS
  Filled 2019-06-23: qty 200

## 2019-06-23 NOTE — Progress Notes (Signed)
Patient ID: Amber Jensen, female   DOB: [birth year], 38 y.o.   MRN: [id number]    2 Days Post-Op  Subjective: Had dressing just changed a couple of hours ago.  No new complaints  ROS: See above, otherwise other systems negative  Objective: Vital signs in last 24 hours: Temp:  [97 F (36.1 C)-97.8 F (36.6 C)] 97.8 F (36.6 C) (12/07 0525) Pulse Rate:  [89-97] 91 (12/07 0525) Resp:  [17-20] 20 (12/07 0525) BP: (121-139)/(73-89) 139/77 (12/07 0525) SpO2:  [100 %] 100 % (12/07 0525) Last BM Date: 06/21/19  Intake/Output from previous day: 12/06 0701 - 12/07 0700 In: 4319.9 [P.O.:2520; I.V.:1799.9] Out: 1000 [Urine:1000] Intake/Output this shift: No intake/output data recorded.  PE: Skin: right thigh wound covered with tegaderm.  Some surrounding skin dimpling and induration.  Tender to touch.  Lab Results:  Recent Labs    06/22/19 0300 06/23/19 0414  WBC 25.1* 21.9*  HGB 10.8* 11.5*  HCT 34.7* 37.2  PLT 317 359   BMET Recent Labs    06/22/19 1750 06/23/19 0414  NA 130* 134*  K 4.2 3.6  CL 101 105  CO2 18* 16*  GLUCOSE 519* 173*  BUN 38* 40*  CREATININE 2.46* 2.19*  CALCIUM 8.1* 8.4*   PT/INR No results for input(s): LABPROT, INR in the last 72 hours. CMP     Component Value Date/Time   NA 134 (L) 06/23/2019 0414   K 3.6 06/23/2019 0414   CL 105 06/23/2019 0414   CO2 16 (L) 06/23/2019 0414   GLUCOSE 173 (H) 06/23/2019 0414   BUN 40 (H) 06/23/2019 0414   CREATININE 2.19 (H) 06/23/2019 0414   CALCIUM 8.4 (L) 06/23/2019 0414   PROT 6.8 06/22/2019 0300   ALBUMIN 2.6 (L) 06/22/2019 0300   AST 15 06/22/2019 0300   ALT 9 06/22/2019 0300   ALKPHOS 95 06/22/2019 0300   BILITOT 0.9 06/22/2019 0300   GFRNONAA 27 (L) 06/23/2019 0414   GFRAA 32 (L) 06/23/2019 0414   Lipase  No results found for: LIPASE     Studies/Results: US Renal  Result Date: 06/22/2019 CLINICAL DATA:  Acute kidney injury. EXAM: RENAL / URINARY TRACT ULTRASOUND COMPLETE  COMPARISON:  None. FINDINGS: Right Kidney: Renal measurements: 11.9 x 5.1 x 5.6 cm. = volume: 177.3 mL . Echogenicity within normal limits. No mass or hydronephrosis visualized. Left Kidney: Renal measurements: 13.3 by 5.5 x 5.5 cm = volume: 212.7 mL. Echogenicity within normal limits. No mass or hydronephrosis visualized. Bladder: Appears normal for degree of bladder distention. Other: None. IMPRESSION: 1. Normal renal sonogram. 2. No acute findings identified. Electronically Signed   By: Signa Kell M.D.   On: 06/22/2019 15:32    Anti-infectives: Anti-infectives (From admission, onward)   Start     Dose/Rate Route Frequency Ordered Stop   06/22/19 1047  vancomycin variable dose per unstable renal function (pharmacist dosing)      Does not apply See admin instructions 06/22/19 1047     06/21/19 2200  vancomycin (VANCOCIN) IVPB 1000 mg/200 mL premix  Status:  Discontinued     1,000 mg 200 mL/hr over 60 Minutes Intravenous Every 12 hours 06/21/19 1142 06/22/19 0827   06/21/19 1000  vancomycin (VANCOCIN) 2,500 mg in sodium chloride 0.9 % 500 mL IVPB     2,500 mg 250 mL/hr over 120 Minutes Intravenous  Once 06/21/19 0929 06/21/19 1245   06/21/19 0430  piperacillin-tazobactam (ZOSYN) IVPB 3.375 g     3.375 g 100 mL/hr over 30 Minutes  Intravenous  Once 06/21/19 0427 06/21/19 0532       Assessment/Plan DM - per medicine.  CBG this am 126 HTN - per medicine  POD 2, s/p I&D of right thigh abscess, Dr. Ninfa Linden -cont NS WD dressing changes BID -will try to come back and see dressing change when daughter gets here.  They are going to teach her how to do her dressing changes -cont abx therapy.  WBC down to 21K from 25K  -Cx with GPC, GPR, GNR.  Await for final results -may shower with wound open  FEN - carb mod diet VTE - lovenox ID - Vanc   LOS: 2 days    Henreitta Cea , [facility] 06/23/2019, 8:57 AM Please see Amion for pager number during day hours  7:00am-4:30pm

## 2019-06-23 NOTE — Progress Notes (Signed)
PROGRESS NOTE    Amber Jensen  MPN:[id number] DOB: 24-Jan-1981 DOA: 06/21/2019 PCP: System, Pcp Not In    Brief Narrative:   38 year old with prior h/o uncontrolled DM, hypertension, admitted for right upper medial thigh swelling, was found to have thigh abscess underwent I&D by general surgery and is currently on broad spectrum IV antibiotics. Meanwhile she was also found to have AKI.    Assessment & Plan:   Active Problems:   Abscess  Right thigh abscess S/p incision and drainage by general surgery on 12/ 5/20.   Follow blood cultures and cultures from the thigh. WOUND CULTURES GROWING MRSA. She was restarted on IV vancomycin.  Pain control.   Uncontrolled diabetes mellitus with hyperglycemia Restarted the home Levemir today, 35 units in the morning and 40 units at night.  CBG (last 3)  Recent Labs    06/23/19 0815 06/23/19 1116 06/23/19 1613  GLUCAP 126* 149* 111*   A1c is 11.9 D/c novolog TIDAC, but continue with SSI.   Essential hypertension Well controlled.     AKI Fe Na is 0.3, probably pre renal causes. No hypotension noted. She denies having any contrast studies recently. But her vancomycin trough is elevated.  Renal ultrasound is negative for hydronephrosis UA is negative for infection.  Continue with IV fluids at NS 100 ml/hr.  Repeat renal parameters in am.    Anemia of chronic disease Get anemia panel.  Hemoglobins table around 11.     Leukocytosis Probably from the abscess. Improving with IV antibiotics.    Hyponatremia Improved with hydration.   DVT prophylaxis: Lovenox Code Status: FULL CODE.  Family Communication: None at bedside.  Disposition Plan: pending clinical improvement.    Consultants:   General surgery  Procedures: S/p incision and drainage by general surgery on 06/21/2019 Antimicrobials: IV vancomycin. Subjective: No chest pain or sob. Thigh pain is improving.   Objective: Vitals:   06/22/19 2053 06/23/19 0525  06/23/19 1009 06/23/19 1344  BP: 121/73 139/77 137/86 123/70  Pulse: 89 91 (!) 101 94  Resp: 17 20 18 18   Temp: (!) 97.5 F (36.4 C) 97.8 F (36.6 C) 98.5 F (36.9 C) 97.8 F (36.6 C)  TempSrc:   Oral Oral  SpO2: 100% 100% 99% 100%  Weight:   133 kg   Height:   5\' 6"  (1.676 m)     Intake/Output Summary (Last 24 hours) at 06/23/2019 1758 Last data filed at 06/23/2019 1631 Gross per 24 hour  Intake 1767.45 ml  Output 701 ml  Net 1066.45 ml   Filed Weights   06/21/19 0925 06/23/19 1009  Weight: 133.4 kg 133 kg    Examination:  General exam:  Calm and comfortable.  Respiratory system: clear to auscultation, no wheezing or rhonchi.  Cardiovascular system: S1S2, RRR, no JVD.  Gastrointestinal system: abd is soft NT ND BS+ Central nervous system: oriented, and no focal deficits.  Extremities: right medial aspect of the thigh wound covered with bandage. Tenderness improving.  Skin: no rashes seen.  Psychiatry:. Mood is appropriate.     Data Reviewed: I have personally reviewed following labs and imaging studies  CBC: Recent Labs  Lab 06/21/19 0149 06/22/19 0300 06/23/19 0414  WBC 22.5* 25.1* 21.9*  NEUTROABS 17.7*  --   --   HGB 11.2* 10.8* 11.5*  HCT 34.7* 34.7* 37.2  MCV 79.0* 82.4 82.3  PLT 311 317 359   Basic Metabolic Panel: Recent Labs  Lab 06/21/19 0149 06/22/19 0300 06/22/19 1226 06/22/19 1750  06/23/19 0414  NA 127* 131*  --  130* 134*  K 3.6 4.5  --  4.2 3.6  CL 95* 102  --  101 105  CO2 19* 17*  --  18* 16*  GLUCOSE 423* 403* 539* 519* 173*  BUN 16 27*  --  38* 40*  CREATININE 1.04* 2.10*  --  2.46* 2.19*  CALCIUM 8.4* 8.0*  --  8.1* 8.4*   GFR: Estimated Creatinine Clearance: 47.9 mL/min (A) (by C-G formula based on SCr of 2.19 mg/dL (H)). Liver Function Tests: Recent Labs  Lab 06/21/19 0149 06/22/19 0300  AST 14* 15  ALT 13 9  ALKPHOS 107 95  BILITOT 1.0 0.9  PROT 7.6 6.8  ALBUMIN 3.1* 2.6*   No results for input(s): LIPASE,  AMYLASE in the last 168 hours. No results for input(s): AMMONIA in the last 168 hours. Coagulation Profile: No results for input(s): INR, PROTIME in the last 168 hours. Cardiac Enzymes: No results for input(s): CKTOTAL, CKMB, CKMBINDEX, TROPONINI in the last 168 hours. BNP (last 3 results) No results for input(s): PROBNP in the last 8760 hours. HbA1C: Recent Labs    06/22/19 0300  HGBA1C 11.9*   CBG: Recent Labs  Lab 06/23/19 0004 06/23/19 0405 06/23/19 0815 06/23/19 1116 06/23/19 1613  GLUCAP 263* 161* 126* 149* 111*   Lipid Profile: No results for input(s): CHOL, HDL, LDLCALC, TRIG, CHOLHDL, LDLDIRECT in the last 72 hours. Thyroid Function Tests: No results for input(s): TSH, T4TOTAL, FREET4, T3FREE, THYROIDAB in the last 72 hours. Anemia Panel: No results for input(s): VITAMINB12, FOLATE, FERRITIN, TIBC, IRON, RETICCTPCT in the last 72 hours. Sepsis Labs: Recent Labs  Lab 06/21/19 0455 06/21/19 0658  LATICACIDVEN 0.9 0.9    Recent Results (from the past 240 hour(s))  SARS CORONAVIRUS 2 (TAT 6-24 HRS) Nasopharyngeal Nasopharyngeal Swab     Status: None   Collection Time: 06/21/19  1:50 AM   Specimen: Nasopharyngeal Swab  Result Value Ref Range Status   SARS Coronavirus 2 NEGATIVE NEGATIVE Final    Comment: (NOTE) SARS-CoV-2 target nucleic acids are NOT DETECTED. The SARS-CoV-2 RNA is generally detectable in upper and lower respiratory specimens during the acute phase of infection. Negative results do not preclude SARS-CoV-2 infection, do not rule out co-infections with other pathogens, and should not be used as the sole basis for treatment or other patient management decisions. Negative results must be combined with clinical observations, patient history, and epidemiological information. The expected result is Negative. Fact Sheet for Patients: HairSlick.nohttps://www.fda.gov/media/138098/download Fact Sheet for Healthcare Providers:  quierodirigir.comhttps://www.fda.gov/media/138095/download This test is not yet approved or cleared by the [place] FDA and  has been authorized for detection and/or diagnosis of SARS-CoV-2 by FDA under an Emergency Use Authorization (EUA). This EUA will remain  in effect (meaning this test can be used) for the duration of the COVID-19 declaration under Section 56 4(b)(1) of the Act, 21 U.S.C. section 360bbb-3(b)(1), unless the authorization is terminated or revoked sooner. Performed at [facility], [address] [place], [place] [address]   Urine culture     Status: Abnormal   Collection Time: 06/21/19  4:21 AM   Specimen: Urine, Clean Catch  Result Value Ref Range Status   Specimen Description   Final    URINE, CLEAN CATCH Performed at [facility], [address] [place], [place] [address]    Special Requests   Final    NONE Performed at [facility], [address]  [place], [place] [address]    Culture MULTIPLE SPECIES PRESENT, SUGGEST RECOLLECTION (A)  Final   Report Status 06/22/2019 FINAL  Final  Culture, blood (routine x 2)     Status: None (Preliminary result)   Collection Time: 06/21/19  4:55 AM   Specimen: BLOOD  Result Value Ref Range Status   Specimen Description   Final    BLOOD RIGHT ANTECUBITAL Performed at [place] [address], [place], [place] [address]    Special Requests   Final    BOTTLES DRAWN AEROBIC AND ANAEROBIC Blood Culture results may not be optimal due to an excessive volume of blood received in culture bottles Performed at [place] [address] [place], [place] [address]    Culture   Final    NO GROWTH 2 DAYS Performed at [place] [address] [place], [place] [address]    Report Status PENDING  Incomplete  Culture, blood (routine x 2)     Status: None (Preliminary result)   Collection Time: 06/21/19  4:55 AM   Specimen: BLOOD LEFT HAND   Result Value Ref Range Status   Specimen Description   Final    BLOOD LEFT HAND Performed at [place] [address], [place], [place] [address]    Special Requests   Final    BOTTLES DRAWN AEROBIC AND ANAEROBIC Blood Culture adequate volume Performed at [place] [address] [place], [place] [address]    Culture   Final    NO GROWTH 2 DAYS Performed at [place] [address], [place], [place] [address]    Report Status PENDING  Incomplete  SARS Coronavirus 2 by RT PCR (hospital order, performed in [facility] hospital lab) Nasopharyngeal Nasopharyngeal Swab     Status: None   Collection Time: 06/21/19  7:00 AM   Specimen: Nasopharyngeal Swab  Result Value Ref Range Status   SARS Coronavirus 2 NEGATIVE NEGATIVE Final    Comment: (NOTE) SARS-CoV-2 target nucleic acids are NOT DETECTED. The SARS-CoV-2 RNA is generally detectable in upper and lower respiratory specimens during the acute phase of infection. The lowest concentration of SARS-CoV-2 viral copies this assay can detect is 250 copies / mL. A negative result does not preclude SARS-CoV-2 infection and should not be used as the sole basis for treatment or other patient management decisions.  A negative result may occur with improper specimen collection / handling, submission of specimen other than nasopharyngeal swab, presence of viral mutation(s) within the areas targeted by this assay, and inadequate number of viral copies (<250 copies / mL). A negative result must be combined with clinical observations, patient history, and epidemiological information. Fact Sheet for Patients:   StrictlyIdeas.no Fact Sheet for Healthcare Providers: BankingDealers.co.za This test is not yet approved or cleared  by the [place] FDA and has been authorized for detection and/or diagnosis of SARS-CoV-2 by FDA under an Emergency  Use Authorization (EUA).  This EUA will remain in effect (meaning this test can be used) for the duration of the COVID-19 declaration under Section 564(b)(1) of the Act, 21 U.S.C. section 360bbb-3(b)(1), unless the authorization is terminated or revoked sooner. Performed at [facility], [place] [address], [place],  [address]   Aerobic/Anaerobic Culture (surgical/deep wound)     Status: None (Preliminary result)   Collection Time: 06/21/19  1:42 PM   Specimen: Abscess  Result Value Ref Range Status   Specimen Description  Final    ABSCESS Performed at [facility], [address], [place], [place] [address]    Special Requests   Final    NONE Performed at [facility], [address], [place], [place] [address]    Gram Stain   Final    RARE WBC PRESENT, PREDOMINANTLY PMN MODERATE GRAM POSITIVE COCCI MODERATE GRAM NEGATIVE RODS FEW GRAM POSITIVE RODS Performed at [facility], [address], [place], [place] [address]    Culture   Final    MODERATE METHICILLIN RESISTANT STAPHYLOCOCCUS AUREUS NO ANAEROBES ISOLATED; CULTURE IN PROGRESS FOR 5 DAYS    Report Status PENDING  Incomplete   Organism ID, Bacteria METHICILLIN RESISTANT STAPHYLOCOCCUS AUREUS  Final      Susceptibility   Methicillin resistant staphylococcus aureus - MIC*    CIPROFLOXACIN <=0.5 SENSITIVE Sensitive     ERYTHROMYCIN >=8 RESISTANT Resistant     GENTAMICIN <=0.5 SENSITIVE Sensitive     OXACILLIN RESISTANT Resistant     TETRACYCLINE >=16 RESISTANT Resistant     VANCOMYCIN <=0.5 SENSITIVE Sensitive     TRIMETH/SULFA <=10 SENSITIVE Sensitive     CLINDAMYCIN <=0.25 SENSITIVE Sensitive     RIFAMPIN <=0.5 SENSITIVE Sensitive     Inducible Clindamycin NEGATIVE Sensitive     * MODERATE METHICILLIN RESISTANT STAPHYLOCOCCUS AUREUS         Radiology Studies: US Renal  Result Date: 06/22/2019 CLINICAL DATA:  Acute kidney injury. EXAM: RENAL  / URINARY TRACT ULTRASOUND COMPLETE COMPARISON:  None. FINDINGS: Right Kidney: Renal measurements: 11.9 x 5.1 x 5.6 cm. = volume: 177.3 mL . Echogenicity within normal limits. No mass or hydronephrosis visualized. Left Kidney: Renal measurements: 13.3 by 5.5 x 5.5 cm = volume: 212.7 mL. Echogenicity within normal limits. No mass or hydronephrosis visualized. Bladder: Appears normal for degree of bladder distention. Other: None. IMPRESSION: 1. Normal renal sonogram. 2. No acute findings identified. Electronically Signed   By: Signa Kell M.D.   On: 06/22/2019 15:32        Scheduled Meds: . enoxaparin (LOVENOX) injection  60 mg Subcutaneous Q24H  . insulin aspart  0-20 Units Subcutaneous TID WC  . insulin aspart  0-5 Units Subcutaneous QHS  . insulin aspart  5 Units Subcutaneous TID WC  . insulin detemir  30 Units Subcutaneous Daily  . insulin detemir  50 Units Subcutaneous QHS  . vancomycin variable dose per unstable renal function (pharmacist dosing)   Does not apply See admin instructions  . vitamin C  1,000 mg Oral Daily   Continuous Infusions: . sodium chloride 100 mL/hr at 06/23/19 1143     LOS: 2 days        Kathlen Mody, MD [facility]  06/23/2019, 5:58 PM

## 2019-06-23 NOTE — Progress Notes (Signed)
Pharmacy Antibiotic Note  Amber Jensen is a 38 y.o. female admitted on 06/21/2019 with abscess R medial thigh.  Pharmacy has been consulted for vancomycin dosing.  Patient admitted with right thigh abscess, underwent I&D on 12/5.   Today, 06/23/19  D3 IV antibiotics, vancomycin held for AKI; checking random levels  WBC elevated but improved   Afebrile starting 12/6  Vancomycin random = 19 mcg/mL this AM; MD did want to continue with vancomycin rather than switching to an alternative agent  Plan:  Vanc 1g IV x 1   Repeat SCr tomorrow and adjust vanc and/or recheck levels as appropriate   Height: 5\' 6"  (167.6 cm) Weight: 293 lb 3.4 oz (133 kg) IBW/kg (Calculated) : 59.3  Temp (24hrs), Avg:97.7 F (36.5 C), Min:97 F (36.1 C), Max:98.5 F (36.9 C)  Recent Labs  Lab 06/21/19 0149 06/21/19 0455 06/21/19 0658 06/22/19 0300 06/22/19 0941 06/22/19 1750 06/23/19 0414  WBC 22.5*  --   --  25.1*  --   --  21.9*  CREATININE 1.04*  --   --  2.10*  --  2.46* 2.19*  LATICACIDVEN  --  0.9 0.9  --   --   --   --   VANCOTROUGH  --   --   --   --  65*  --   --   VANCORANDOM  --   --   --   --   --   --  19    Estimated Creatinine Clearance: 47.9 mL/min (A) (by C-G formula based on SCr of 2.19 mg/dL (H)).    Allergies  Allergen Reactions  . Percocet [Oxycodone-Acetaminophen] Other (See Comments)    Almost passed out   . Vicodin [Hydrocodone-Acetaminophen] Other (See Comments)    Almost passed out     Antimicrobials this admission: 12/5 Zosyn x 1 12/5 Vanc>>  Dose adjustments this admission:  Microbiology results: 12/5 Flu A/B neg 12/5 MRSA PCR: Sent 12/5 BCx: ngtd 12/5 UCx: multiple species, suggest recollection 12/5 SARS-2: negative 12/5 Wound: moderate S Aureus, sens pending  Thank you for allowing pharmacy to be a part of this patient's care.  Reuel Boom, PharmD, BCPS [phone] 06/23/2019, 1:01 PM

## 2019-06-23 NOTE — TOC Progression Note (Signed)
Transition of Care [facility]) - Progression Note    Patient Details  Name: Amber Jensen MRN: [id number] Date of Birth: 1981-02-01  Transition of Care [facility]) CM/SW [place], LCSW Phone Number: 06/23/2019, 4:02 PM  Clinical Narrative:   Patient has [facility] health [facility]) that has been arranged at discharge           Expected Discharge Plan and Services                                                 Social Determinants of Health ([place]) Interventions    Readmission Risk Interventions No flowsheet data found.

## 2019-06-24 DIAGNOSIS — E1365 Other specified diabetes mellitus with hyperglycemia: Secondary | ICD-10-CM | POA: Diagnosis present

## 2019-06-24 DIAGNOSIS — IMO0002 Reserved for concepts with insufficient information to code with codable children: Secondary | ICD-10-CM | POA: Diagnosis present

## 2019-06-24 DIAGNOSIS — I10 Essential (primary) hypertension: Secondary | ICD-10-CM | POA: Diagnosis present

## 2019-06-24 LAB — GLUCOSE, CAPILLARY
Glucose-Capillary: 74 mg/dL (ref 70–99)
Glucose-Capillary: 131 mg/dL — ABNORMAL HIGH (ref 70–99)
Glucose-Capillary: 154 mg/dL — ABNORMAL HIGH (ref 70–99)
Glucose-Capillary: 87 mg/dL (ref 70–99)

## 2019-06-24 LAB — CBC
HCT: 33.8 % — ABNORMAL LOW (ref 36.0–46.0)
Hemoglobin: 10.6 g/dL — ABNORMAL LOW (ref 12.0–15.0)
MCH: 25.3 pg — ABNORMAL LOW (ref 26.0–34.0)
MCHC: 31.4 g/dL (ref 30.0–36.0)
MCV: 80.7 fL (ref 80.0–100.0)
Platelets: 323 10*3/uL (ref 150–400)
RBC: 4.19 MIL/uL (ref 3.87–5.11)
RDW: 13.3 % (ref 11.5–15.5)
WBC: 16.6 10*3/uL — ABNORMAL HIGH (ref 4.0–10.5)
nRBC: 0 % (ref 0.0–0.2)

## 2019-06-24 LAB — URINE CULTURE: Culture: <10000 — AB

## 2019-06-24 LAB — COMPREHENSIVE METABOLIC PANEL
ALT: 9 U/L (ref 0–44)
AST: 10 U/L — ABNORMAL LOW (ref 15–41)
Albumin: 2.1 g/dL — ABNORMAL LOW (ref 3.5–5.0)
Alkaline Phosphatase: 82 U/L (ref 38–126)
Anion gap: 10 (ref 5–15)
BUN: 33 mg/dL — ABNORMAL HIGH (ref 6–20)
CO2: 19 mmol/L — ABNORMAL LOW (ref 22–32)
Calcium: 8.1 mg/dL — ABNORMAL LOW (ref 8.9–10.3)
Chloride: 108 mmol/L (ref 98–111)
Creatinine, Ser: 2.09 mg/dL — ABNORMAL HIGH (ref 0.44–1.00)
GFR calc Af Amer: 33 mL/min — ABNORMAL LOW (ref >60)
GFR calc non Af Amer: 29 mL/min — ABNORMAL LOW (ref >60)
Glucose, Bld: 124 mg/dL — ABNORMAL HIGH (ref 70–99)
Potassium: 3.4 mmol/L — ABNORMAL LOW (ref 3.5–5.1)
Sodium: 137 mmol/L (ref 135–145)
Total Bilirubin: 0.4 mg/dL (ref 0.3–1.2)
Total Protein: 6.2 g/dL — ABNORMAL LOW (ref 6.5–8.1)

## 2019-06-24 LAB — VANCOMYCIN, RANDOM: Vancomycin Rm: 14

## 2019-06-24 MED ORDER — SENNOSIDES-DOCUSATE SODIUM 8.6-50 MG PO TABS
1.0000 | ORAL_TABLET | Freq: Two times a day (BID) | ORAL | Status: DC
  Administered 2019-06-24 – 2019-06-29 (×9): 1 via ORAL
  Filled 2019-06-24 (×12): qty 1

## 2019-06-24 MED ORDER — FERROUS SULFATE 325 (65 FE) MG PO TABS
325.0000 mg | ORAL_TABLET | Freq: Two times a day (BID) | ORAL | Status: DC
  Administered 2019-06-24 – 2019-06-29 (×10): 325 mg via ORAL
  Filled 2019-06-24 (×10): qty 1

## 2019-06-24 MED ORDER — INSULIN DETEMIR 100 UNIT/ML ~~LOC~~ SOLN
20.0000 [IU] | Freq: Every day | SUBCUTANEOUS | Status: DC
  Administered 2019-06-25 – 2019-06-29 (×5): 20 [IU] via SUBCUTANEOUS
  Filled 2019-06-24 (×5): qty 0.2

## 2019-06-24 MED ORDER — INSULIN DETEMIR 100 UNIT/ML ~~LOC~~ SOLN
35.0000 [IU] | Freq: Every day | SUBCUTANEOUS | Status: DC
  Administered 2019-06-24 – 2019-06-28 (×5): 35 [IU] via SUBCUTANEOUS
  Filled 2019-06-24 (×6): qty 0.35

## 2019-06-24 MED ORDER — INSULIN ASPART 100 UNIT/ML ~~LOC~~ SOLN
0.0000 [IU] | Freq: Three times a day (TID) | SUBCUTANEOUS | Status: DC
  Administered 2019-06-24: 3 [IU] via SUBCUTANEOUS
  Administered 2019-06-24: 12:00:00 2 [IU] via SUBCUTANEOUS
  Administered 2019-06-25: 5 [IU] via SUBCUTANEOUS
  Administered 2019-06-25 – 2019-06-26 (×2): 2 [IU] via SUBCUTANEOUS
  Administered 2019-06-27: 09:00:00 8 [IU] via SUBCUTANEOUS
  Administered 2019-06-27 – 2019-06-29 (×4): 3 [IU] via SUBCUTANEOUS
  Administered 2019-06-29: 2 [IU] via SUBCUTANEOUS

## 2019-06-24 MED ORDER — POTASSIUM CHLORIDE CRYS ER 20 MEQ PO TBCR
40.0000 meq | EXTENDED_RELEASE_TABLET | Freq: Once | ORAL | Status: AC
  Administered 2019-06-24: 40 meq via ORAL
  Filled 2019-06-24: qty 2

## 2019-06-24 MED ORDER — VANCOMYCIN HCL IN DEXTROSE 1-5 GM/200ML-% IV SOLN
1000.0000 mg | Freq: Every day | INTRAVENOUS | Status: DC
  Administered 2019-06-24 – 2019-06-26 (×3): 1000 mg via INTRAVENOUS
  Filled 2019-06-24 (×3): qty 200

## 2019-06-24 NOTE — Progress Notes (Signed)
Pharmacy Antibiotic Note  Amber Jensen is a 38 y.o. female admitted on 06/21/2019 with abscess R medial thigh. Pharmacy has been consulted for vancomycin dosing. Underwent I&D on 12/5. AKI noted on 12/6 with SCr doubling in 24 hr; possibly d/t vancomycin. However MD did wish to continue with vanc and dosing resumed per random levels.  Today, 06/24/19  D4 vancomycin  WBC elevated but significantly improved   Afebrile starting 12/6  SCr continues to improve, but still ~2x baseline  24-hr VRm = 14 mcg/mL  Plan:  Resume vancomycin 1g IV q24 hr  F/u renal function; adjust as SCr improves  Unable to narrow vanc with MRSA growing in abscess; will plan to check AUC once renal function stabilized   Height: 5\' 6"  (167.6 cm) Weight: 293 lb 3.4 oz (133 kg) IBW/kg (Calculated) : 59.3  Temp (24hrs), Avg:99.1 F (37.3 C), Min:98.8 F (37.1 C), Max:99.8 F (37.7 C)  Recent Labs  Lab 06/21/19 0149 06/21/19 0455 06/21/19 0658 06/22/19 0300 06/22/19 0941 06/22/19 1750 06/23/19 0414 06/24/19 0542 06/24/19 1447  WBC 22.5*  --   --  25.1*  --   --  21.9* 16.6*  --   CREATININE 1.04*  --   --  2.10*  --  2.46* 2.19* 2.09*  --   LATICACIDVEN  --  0.9 0.9  --   --   --   --   --   --   VANCOTROUGH  --   --   --   --  80*  --   --   --   --   VANCORANDOM  --   --   --   --   --   --  19  --  14    Estimated Creatinine Clearance: 50.2 mL/min (A) (by C-G formula based on SCr of 2.09 mg/dL (H)).    Allergies  Allergen Reactions  . Percocet [Oxycodone-Acetaminophen] Other (See Comments)    Almost passed out   . Vicodin [Hydrocodone-Acetaminophen] Other (See Comments)    Almost passed out     Antimicrobials this admission: 12/5 Zosyn x 1 12/5 Vanc >>  Dose adjustments this admission: dosing per levels 12/7 VRm = 19 at 0500, 1g x 1 at 1500 12/8 VRm = 14 at 1500 >> 1g q24  Microbiology results: 12/5 Flu A/B neg 12/5 MRSA PCR: Sent 12/5 BCx: ngtd 12/5 UCx: multiple species,  suggest recollection 12/5 Cov2: negative 12/5 Abscess: moderate MRSA  Thank you for allowing pharmacy to be a part of this patient's care.  Reuel Boom, PharmD, BCPS [phone] 06/24/2019, 4:37 PM

## 2019-06-24 NOTE — Progress Notes (Signed)
Patient triggering Yellow [place] due to pulse of 111. Patient is stable, having a little pain from r leg thigh abscess. Hosie Poisson, MD notified.

## 2019-06-24 NOTE — Evaluation (Signed)
Physical Therapy Evaluation Patient Details Name: Amber Jensen MRN: [id number] DOB: 1980/12/16 Today's Date: 06/24/2019   History of Present Illness  38 yo female admitted with abscess. S/P I&D R thigh 12/5. Hx of obesity, DM, HTN  Clinical Impression  On eval, pt was Min guard assist for mobility.She walked ~400 feet around the unit. She did rely on IV pole for support.Discussed DME-pt politely declines a RW. She is open to using a cane. Unsure if that is issued here in the hospital or if she has to pick one up on her own. Recommend daily ambulation in hallways as able. Will follow during hospital stay.     Follow Up Recommendations No PT follow up;Supervision for mobility/OOB    Equipment Recommendations  Cane    Recommendations for Other Services       Precautions / Restrictions Precautions Precautions: Fall Restrictions Weight Bearing Restrictions: No      Mobility  Bed Mobility Overal bed mobility: Modified Independent             General bed mobility comments: Increased time. Pt relied on bedrail. HOB elevated.  Transfers Overall transfer level: Needs assistance   Transfers: Sit to/from Stand           General transfer comment: Increased time. Pt used IV pole to pull up on.  Ambulation/Gait Ambulation/Gait assistance: Min guard Gait Distance (Feet): 400 Feet Assistive device: IV Pole Gait Pattern/deviations: Wide base of support     General Gait Details: close guard for safety. dyspnea 2/4.  Stairs            Wheelchair Mobility    Modified Rankin (Stroke Patients Only)       Balance Overall balance assessment: Mild deficits observed, not formally tested                                           Pertinent Vitals/Pain Pain Assessment: 0-10 Pain Score: 2  Pain Location: R LE Pain Descriptors / Indicators: Sore;Discomfort Pain Intervention(s): Monitored during session    Home Living Family/patient expects to be  discharged to:: Private residence Living Arrangements: Children   Type of Home: Apartment Home Access: Stairs to enter Entrance Stairs-Rails: Right Entrance Stairs-Number of Steps: 2 flights Home Layout: One level Home Equipment: None      Prior Function Level of Independence: Independent               Hand Dominance        Extremity/Trunk Assessment   Upper Extremity Assessment Upper Extremity Assessment: Generalized weakness    Lower Extremity Assessment Lower Extremity Assessment: Generalized weakness    Cervical / Trunk Assessment Cervical / Trunk Assessment: Normal  Communication   Communication: No difficulties  Cognition Arousal/Alertness: Awake/alert Behavior During Therapy: WFL for tasks assessed/performed Overall Cognitive Status: Within Functional Limits for tasks assessed                                        General Comments      Exercises     Assessment/Plan    PT Assessment Patient needs continued PT services  PT Problem List Decreased mobility;Pain;Decreased balance       PT Treatment Interventions Therapeutic activities;Therapeutic exercise;Patient/family education;Functional mobility training;Gait training    PT Goals (Current goals can be found  in the Care Plan section)  Acute Rehab PT Goals Patient Stated Goal: home soon PT Goal Formulation: With patient Time For Goal Achievement: 07/08/19 Potential to Achieve Goals: Good    Frequency Min 3X/week   Barriers to discharge        Co-evaluation               AM-PAC PT "6 Clicks" Mobility  Outcome Measure Help needed turning from your back to your side while in a flat bed without using bedrails?: None Help needed moving from lying on your back to sitting on the side of a flat bed without using bedrails?: None Help needed moving to and from a bed to a chair (including a wheelchair)?: A Little Help needed standing up from a chair using your arms (e.g.,  wheelchair or bedside chair)?: A Little Help needed to walk in hospital room?: A Little Help needed climbing 3-5 steps with a railing? : A Little 6 Click Score: 20    End of Session   Activity Tolerance: Patient tolerated treatment well Patient left: in bed;with call bell/phone within reach   PT Visit Diagnosis: Pain;Difficulty in walking, not elsewhere classified (R26.2) Pain - Right/Left: Right Pain - part of body: Leg    Time: 6387-5643 PT Time Calculation (min) (ACUTE ONLY): 24 min   Charges:   PT Evaluation $PT Eval Moderate Complexity: 1 Mod PT Treatments $Gait Training: 8-22 mins          Weston Anna, PT Acute Rehabilitation Services Pager: [phone] Office: [phone]

## 2019-06-24 NOTE — Progress Notes (Signed)
Patient ID: Amber Jensen, female   DOB: Nov 24, 1980, 38 y.o.   MRN: [id number]    3 Days Post-Op  Subjective: Still with some pain but is feeling better overall.  Tramadol seems to be helping with pain.  ROS: See above, otherwise other systems negative  Objective: Vital signs in last 24 hours: Temp:  [97.8 F (36.6 C)-98.9 F (37.2 C)] 98.9 F (37.2 C) (12/08 0602) Pulse Rate:  [94-107] 107 (12/08 0602) Resp:  [17-18] 17 (12/08 0602) BP: (123-156)/(70-83) 156/80 (12/08 0602) SpO2:  [97 %-100 %] 98 % (12/08 0602) Last BM Date: 06/23/19  Intake/Output from previous day: 12/07 0701 - 12/08 0700 In: 2958.4 [P.O.:835; I.V.:1923.4; IV Piggyback:200] Out: 1 [Urine:1] Intake/Output this shift: Total I/O In: 480 [P.O.:480] Out: 1 [Urine:1]  PE: Skin: wound was not well packed.  Septations had to be broken up with my finger.  Some seropurulent drainage was noted along with malodor.  The tissue was otherwise clean and pink.  No evidence of necrotic tissue was noted. She does have a small area of skin breakdown just distal to the wound that does connect to the wound.  Lab Results:  Recent Labs    06/23/19 0414 06/24/19 0542  WBC 21.9* 16.6*  HGB 11.5* 10.6*  HCT 37.2 33.8*  PLT 359 323   BMET Recent Labs    06/23/19 0414 06/24/19 0542  NA 134* 137  K 3.6 3.4*  CL 105 108  CO2 16* 19*  GLUCOSE 173* 124*  BUN 40* 33*  CREATININE 2.19* 2.09*  CALCIUM 8.4* 8.1*   PT/INR No results for input(s): LABPROT, INR in the last 72 hours. CMP     Component Value Date/Time   NA 137 06/24/2019 0542   K 3.4 (L) 06/24/2019 0542   CL 108 06/24/2019 0542   CO2 19 (L) 06/24/2019 0542   GLUCOSE 124 (H) 06/24/2019 0542   BUN 33 (H) 06/24/2019 0542   CREATININE 2.09 (H) 06/24/2019 0542   CALCIUM 8.1 (L) 06/24/2019 0542   PROT 6.2 (L) 06/24/2019 0542   ALBUMIN 2.1 (L) 06/24/2019 0542   AST 10 (L) 06/24/2019 0542   ALT 9 06/24/2019 0542   ALKPHOS 82 06/24/2019 0542   BILITOT 0.4  06/24/2019 0542   GFRNONAA 29 (L) 06/24/2019 0542   GFRAA 33 (L) 06/24/2019 0542   Lipase  No results found for: LIPASE     Studies/Results: No results found.  Anti-infectives: Anti-infectives (From admission, onward)   Start     Dose/Rate Route Frequency Ordered Stop   06/23/19 1400  vancomycin (VANCOCIN) IVPB 1000 mg/200 mL premix     1,000 mg 200 mL/hr over 60 Minutes Intravenous  Once 06/23/19 1304 06/23/19 1524   06/22/19 1047  vancomycin variable dose per unstable renal function (pharmacist dosing)      Does not apply See admin instructions 06/22/19 1047     06/21/19 2200  vancomycin (VANCOCIN) IVPB 1000 mg/200 mL premix  Status:  Discontinued     1,000 mg 200 mL/hr over 60 Minutes Intravenous Every 12 hours 06/21/19 1142 06/22/19 0827   06/21/19 1000  vancomycin (VANCOCIN) 2,500 mg in sodium chloride 0.9 % 500 mL IVPB     2,500 mg 250 mL/hr over 120 Minutes Intravenous  Once 06/21/19 0929 06/21/19 1245   06/21/19 0430  piperacillin-tazobactam (ZOSYN) IVPB 3.375 g     3.375 g 100 mL/hr over 30 Minutes Intravenous  Once 06/21/19 0427 06/21/19 0532       Assessment/Plan DM - per  medicine.   HTN - per medicine  POD 3, s/p I&D of right thigh abscess, Dr. Magnus Ivan -cont NS WD dressing changes BID -given drainage noted, I have asked the patient get up and shower today before her packing be replaced.  If the shower cleans her wound up some then I think she may be stable for DC home tomorrow with dressing changes by her daughter who is coming up today to learn how to do this. -Cx MRSA  FEN - carb mod diet VTE - lovenox ID - Vanc, at discharge can transition to doxy   LOS: 3 days    Letha Cape , [facility] 06/24/2019, 10:45 AM Please see Amion for pager number during day hours 7:00am-4:30pm

## 2019-06-24 NOTE — Progress Notes (Addendum)
Patient's daughter came. Demonstration and teaching provided to patient and daughter on dressing changes.

## 2019-06-24 NOTE — Progress Notes (Signed)
PROGRESS NOTE    Amber Jensen  MRN:[id number]:[id number] DOB: [birth year] DOA: 06/21/2019 PCP: System, Pcp Not In    Brief Narrative:   38 year old lady with prior h/o uncontrolled DM, hypertension, admitted for right upper medial thigh swelling, was found to have thigh abscess underwent I&D by general surgery and is currently on broad spectrum IV antibiotics.  She was also found to have an acute kidney injury. Tissue cultures growing MRSA.    Assessment & Plan:   Active Problems:   Abscess  Right thigh abscess S/p incision and drainage by general surgery  Dr Magnus IvanBlackman on 06/21/19. Blood cultures have been negative so far but and tissue cultures positive for MRSA.  She was restarted on IV vancomycin. Pain well controlled.    Diabetes Mellitus:  cbg's well controlled. Decreased the levemir to 20 units daily and 35 units at bedtime.   CBG (last 3)  Recent Labs    06/23/19 1613 06/23/19 2112 06/24/19 0731  GLUCAP 111* 95 74   A1c is 11.9 Continue with SSI.   Essential hypertension Well controlled.      AKI Fe Na is 0.3, probably pre renal causes. No hypotension noted. She denies having any contrast studies recently. But her vancomycin trough is elevated.  Renal ultrasound is negative for hydronephrosis UA is negative for infection.  Continue with IV fluids at NS 100 ml/hr.  Repeat renal parameters in am show improvement.     Anemia of chronic disease/ normocytic anemia.  Anemia panel shows low iron levels. Iron supplementation added.  Hemoglobins table around 11.     Leukocytosis Probably from the abscess. Improving with IV antibiotics.    Hyponatremia Improved with hydration.   Hypokalemia: replaced.   DVT prophylaxis: Lovenox Code Status: FULL CODE.  Family Communication: None at bedside.  Disposition Plan: pending clinical improvement.    Consultants:   General surgery  Procedures: S/p incision and drainage by general surgery on 06/21/2019 Antimicrobials:  IV vancomycin. Subjective: No chest pain or sob. Thigh pain is improving.   Objective: Vitals:   06/23/19 1009 06/23/19 1344 06/23/19 2109 06/24/19 0602  BP: 137/86 123/70 131/83 (!) 156/80  Pulse: (!) 101 94 98 (!) 107  Resp: 18 18 17 17   Temp: 98.5 F (36.9 C) 97.8 F (36.6 C) 98.8 F (37.1 C) 98.9 F (37.2 C)  TempSrc: Oral Oral Oral Oral  SpO2: 99% 100% 97% 98%  Weight: 133 kg     Height: 5\' 6"  (1.676 m)       Intake/Output Summary (Last 24 hours) at 06/24/2019 0902 Last data filed at 06/24/2019 0855 Gross per 24 hour  Intake 3198.36 ml  Output 2 ml  Net 3196.36 ml   Filed Weights   06/21/19 0925 06/23/19 1009  Weight: 133.4 kg 133 kg    Examination:  General exam: alert and comfortable.  Respiratory system: clear to auscultation, no wheezing or rhonchi.  Cardiovascular system: S1S2, RRR, no JVD,  Gastrointestinal system: abd is soft non tender non distended bowel sounds normal.  Central nervous system: alert and oriented.  Extremities: right medial thigh wound bandaged, persistent induration.  Skin: no rashes seen.  Psychiatry:. Mood is appropriate.     Data Reviewed: I have personally reviewed following labs and imaging studies  CBC: Recent Labs  Lab 06/21/19 0149 06/22/19 0300 06/23/19 0414 06/24/19 0542  WBC 22.5* 25.1* 21.9* 16.6*  NEUTROABS 17.7*  --   --   --   HGB 11.2* 10.8* 11.5* 10.6*  HCT 34.7*  34.7* 37.2 33.8*  MCV 79.0* 82.4 82.3 80.7  PLT 311 317 359 323   Basic Metabolic Panel: Recent Labs  Lab 06/21/19 0149 06/22/19 0300 06/22/19 1226 06/22/19 1750 06/23/19 0414 06/24/19 0542  NA 127* 131*  --  130* 134* 137  K 3.6 4.5  --  4.2 3.6 3.4*  CL 95* 102  --  101 105 108  CO2 19* 17*  --  18* 16* 19*  GLUCOSE 423* 403* 539* 519* 173* 124*  BUN 16 27*  --  38* 40* 33*  CREATININE 1.04* 2.10*  --  2.46* 2.19* 2.09*  CALCIUM 8.4* 8.0*  --  8.1* 8.4* 8.1*   GFR: Estimated Creatinine Clearance: 50.2 mL/min (A) (by C-G formula  based on SCr of 2.09 mg/dL (H)). Liver Function Tests: Recent Labs  Lab 06/21/19 0149 06/22/19 0300 06/24/19 0542  AST 14* 15 10*  ALT 13 9 9   ALKPHOS 107 95 82  BILITOT 1.0 0.9 0.4  PROT 7.6 6.8 6.2*  ALBUMIN 3.1* 2.6* 2.1*   No results for input(s): LIPASE, AMYLASE in the last 168 hours. No results for input(s): AMMONIA in the last 168 hours. Coagulation Profile: No results for input(s): INR, PROTIME in the last 168 hours. Cardiac Enzymes: No results for input(s): CKTOTAL, CKMB, CKMBINDEX, TROPONINI in the last 168 hours. BNP (last 3 results) No results for input(s): PROBNP in the last 8760 hours. HbA1C: Recent Labs    06/22/19 0300  HGBA1C 11.9*   CBG: Recent Labs  Lab 06/23/19 0815 06/23/19 1116 06/23/19 1613 06/23/19 2112 06/24/19 0731  GLUCAP 126* 149* 111* 95 74   Lipid Profile: No results for input(s): CHOL, HDL, LDLCALC, TRIG, CHOLHDL, LDLDIRECT in the last 72 hours. Thyroid Function Tests: No results for input(s): TSH, T4TOTAL, FREET4, T3FREE, THYROIDAB in the last 72 hours. Anemia Panel: Recent Labs    06/23/19 2013  VITAMINB12 1,778*  FOLATE 8.4  FERRITIN 131  TIBC 187*  IRON 21*  RETICCTPCT 1.0   Sepsis Labs: Recent Labs  Lab 06/21/19 0455 06/21/19 0658  LATICACIDVEN 0.9 0.9    Recent Results (from the past 240 hour(s))  SARS CORONAVIRUS 2 (TAT 6-24 HRS) Nasopharyngeal Nasopharyngeal Swab     Status: None   Collection Time: 06/21/19  1:50 AM   Specimen: Nasopharyngeal Swab  Result Value Ref Range Status   SARS Coronavirus 2 NEGATIVE NEGATIVE Final    Comment: (NOTE) SARS-CoV-2 target nucleic acids are NOT DETECTED. The SARS-CoV-2 RNA is generally detectable in upper and lower respiratory specimens during the acute phase of infection. Negative results do not preclude SARS-CoV-2 infection, do not rule out co-infections with other pathogens, and should not be used as the sole basis for treatment or other patient management decisions.  Negative results must be combined with clinical observations, patient history, and epidemiological information. The expected result is Negative. Fact Sheet for Patients: 14/05/20 Fact Sheet for Healthcare Providers: HairSlick.no This test is not yet approved or cleared by the quierodirigir.com FDA and  has been authorized for detection and/or diagnosis of SARS-CoV-2 by FDA under an Emergency Use Authorization (EUA). This EUA will remain  in effect (meaning this test can be used) for the duration of the COVID-19 declaration under Section 56 4(b)(1) of the Act, 21 U.S.C. section 360bbb-3(b)(1), unless the authorization is terminated or revoked sooner. Performed at [facility], [address], [place], [place]   Urine culture     Status: Abnormal   Collection Time: 06/21/19  4:21 AM   Specimen: Urine, Clean Catch  Result Value Ref Range Status   Specimen Description   Final    URINE, CLEAN CATCH Performed at [facility], [address], [place], [place] [address]    Special Requests   Final    NONE Performed at [facility], [address] [place], [place] [address]    Culture MULTIPLE SPECIES PRESENT, SUGGEST RECOLLECTION (A)  Final   Report Status 06/22/2019 FINAL  Final  Culture, blood (routine x 2)     Status: None (Preliminary result)   Collection Time: 06/21/19  4:55 AM   Specimen: BLOOD  Result Value Ref Range Status   Specimen Description   Final    BLOOD RIGHT ANTECUBITAL Performed at [facility], [address], [place], [place] [address]    Special Requests   Final    BOTTLES DRAWN AEROBIC AND ANAEROBIC Blood Culture results may not be optimal due to an excessive volume of blood received in culture bottles Performed at [facility], [address] [place], [place] [address]    Culture   Final    NO GROWTH 3  DAYS Performed at [facility], [address] [place], [place] [address]    Report Status PENDING  Incomplete  Culture, blood (routine x 2)     Status: None (Preliminary result)   Collection Time: 06/21/19  4:55 AM   Specimen: BLOOD LEFT HAND  Result Value Ref Range Status   Specimen Description   Final    BLOOD LEFT HAND Performed at [facility], [address] [place], [place] [address]    Special Requests   Final    BOTTLES DRAWN AEROBIC AND ANAEROBIC Blood Culture adequate volume Performed at [facility], [address] [place], [place] [address]    Culture   Final    NO GROWTH 3 DAYS Performed at [facility], [address] [place], [place] [address]    Report Status PENDING  Incomplete  SARS Coronavirus 2 by RT PCR (hospital order, performed in [facility] hospital lab) Nasopharyngeal Nasopharyngeal Swab     Status: None   Collection Time: 06/21/19  7:00 AM   Specimen: Nasopharyngeal Swab  Result Value Ref Range Status   SARS Coronavirus 2 NEGATIVE NEGATIVE Final    Comment: (NOTE) SARS-CoV-2 target nucleic acids are NOT DETECTED. The SARS-CoV-2 RNA is generally detectable in upper and lower respiratory specimens during the acute phase of infection. The lowest concentration of SARS-CoV-2 viral copies this assay can detect is 250 copies / mL. A negative result does not preclude SARS-CoV-2 infection and should not be used as the sole basis for treatment or other patient management decisions.  A negative result may occur with improper specimen collection / handling, submission of specimen other than nasopharyngeal swab, presence of viral mutation(s) within the areas targeted by this assay, and inadequate number of viral copies (<250 copies / mL). A negative result must be combined with clinical observations, patient history, and epidemiological information. Fact Sheet for Patients:    BoilerBrush.com.cy Fact Sheet for Healthcare Providers: https://pope.com/ This test is not yet approved or cleared  by the [place] FDA and has been authorized for detection and/or diagnosis of SARS-CoV-2 by FDA under an Emergency Use Authorization (EUA).  This EUA will remain in effect (meaning this test can be used) for the duration of the COVID-19 declaration under Section 564(b)(1) of the  Act, 21 U.S.C. section 360bbb-3(b)(1), unless the authorization is terminated or revoked sooner. Performed at [facility], [place] [address], [place], [place] [address]   Aerobic/Anaerobic Culture (surgical/deep wound)     Status: None (Preliminary result)   Collection Time: 06/21/19  1:42 PM   Specimen: Abscess  Result Value Ref Range Status   Specimen Description   Final    ABSCESS Performed at [place] [address], [place], [place] [address]    Special Requests   Final    NONE Performed at [facility], [place] [address], [place], [place] [address]    Gram Stain   Final    RARE WBC PRESENT, PREDOMINANTLY PMN MODERATE GRAM POSITIVE COCCI MODERATE GRAM NEGATIVE RODS FEW GRAM POSITIVE RODS Performed at [facility], [place] [address] [place], [place] [address]    Culture   Final    MODERATE METHICILLIN RESISTANT STAPHYLOCOCCUS AUREUS NO ANAEROBES ISOLATED; CULTURE IN PROGRESS FOR 5 DAYS    Report Status PENDING  Incomplete   Organism ID, Bacteria METHICILLIN RESISTANT STAPHYLOCOCCUS AUREUS  Final      Susceptibility   Methicillin resistant staphylococcus aureus - MIC*    CIPROFLOXACIN <=0.5 SENSITIVE Sensitive     ERYTHROMYCIN >=8 RESISTANT Resistant     GENTAMICIN <=0.5 SENSITIVE Sensitive     OXACILLIN RESISTANT Resistant     TETRACYCLINE >=16 RESISTANT Resistant     VANCOMYCIN <=0.5 SENSITIVE Sensitive     TRIMETH/SULFA <=10 SENSITIVE Sensitive     CLINDAMYCIN  <=0.25 SENSITIVE Sensitive     RIFAMPIN <=0.5 SENSITIVE Sensitive     Inducible Clindamycin NEGATIVE Sensitive     * MODERATE METHICILLIN RESISTANT STAPHYLOCOCCUS AUREUS  Culture, Urine     Status: Abnormal   Collection Time: 06/22/19 10:40 AM   Specimen: Urine, Clean Catch  Result Value Ref Range Status   Specimen Description   Final    URINE, CLEAN CATCH Performed at [facility], [place] [address] [place], [place] [address]    Special Requests   Final    NONE Performed at [facility], [place] [address], [place], [place] [address]    Culture (A)  Final    <10,000 COLONIES/mL INSIGNIFICANT GROWTH Performed at [place] [address], [place], [place] [address]    Report Status 06/24/2019 FINAL  Final         Radiology Studies: US Renal  Result Date: 06/22/2019 CLINICAL DATA:  Acute kidney injury. EXAM: RENAL / URINARY TRACT ULTRASOUND COMPLETE COMPARISON:  None. FINDINGS: Right Kidney: Renal measurements: 11.9 x 5.1 x 5.6 cm. = volume: 177.3 mL . Echogenicity within normal limits. No mass or hydronephrosis visualized. Left Kidney: Renal measurements: 13.3 by 5.5 x 5.5 cm = volume: 212.7 mL. Echogenicity within normal limits. No mass or hydronephrosis visualized. Bladder: Appears normal for degree of bladder distention. Other: None. IMPRESSION: 1. Normal renal sonogram. 2. No acute findings identified. Electronically Signed   By: Kerby Moors M.D.   On: 06/22/2019 15:32        Scheduled Meds: . enoxaparin (LOVENOX) injection  60 mg Subcutaneous Q24H  . ferrous sulfate  325 mg Oral BID WC  . insulin aspart  0-20 Units Subcutaneous TID WC  . insulin aspart  0-5 Units Subcutaneous QHS  . insulin detemir  30 Units Subcutaneous Daily  . insulin detemir  50 Units Subcutaneous QHS  . potassium chloride  40 mEq Oral Once  . senna-docusate  1 tablet Oral BID  . vancomycin variable dose per unstable renal function (pharmacist dosing)    Does not apply See admin instructions  . vitamin C  1,000 mg Oral Daily   Continuous Infusions: . sodium chloride 100 mL/hr at 06/24/19 0440     LOS: 3 days        Kathlen Mody, MD [facility]  06/24/2019, 9:02 AM

## 2019-06-25 DIAGNOSIS — E1329 Other specified diabetes mellitus with other diabetic kidney complication: Secondary | ICD-10-CM

## 2019-06-25 DIAGNOSIS — A419 Sepsis, unspecified organism: Secondary | ICD-10-CM | POA: Diagnosis present

## 2019-06-25 DIAGNOSIS — N179 Acute kidney failure, unspecified: Secondary | ICD-10-CM | POA: Diagnosis present

## 2019-06-25 DIAGNOSIS — I10 Essential (primary) hypertension: Secondary | ICD-10-CM

## 2019-06-25 DIAGNOSIS — R6520 Severe sepsis without septic shock: Secondary | ICD-10-CM

## 2019-06-25 DIAGNOSIS — E1365 Other specified diabetes mellitus with hyperglycemia: Secondary | ICD-10-CM

## 2019-06-25 DIAGNOSIS — A4102 Sepsis due to Methicillin resistant Staphylococcus aureus: Principal | ICD-10-CM

## 2019-06-25 LAB — GLUCOSE, CAPILLARY
Glucose-Capillary: 70 mg/dL (ref 70–99)
Glucose-Capillary: 214 mg/dL — ABNORMAL HIGH (ref 70–99)
Glucose-Capillary: 144 mg/dL — ABNORMAL HIGH (ref 70–99)
Glucose-Capillary: 186 mg/dL — ABNORMAL HIGH (ref 70–99)

## 2019-06-25 LAB — AEROBIC/ANAEROBIC CULTURE W GRAM STAIN (SURGICAL/DEEP WOUND)

## 2019-06-25 LAB — CREATININE, SERUM
Creatinine, Ser: 1.93 mg/dL — ABNORMAL HIGH (ref 0.44–1.00)
GFR calc Af Amer: 37 mL/min — ABNORMAL LOW (ref >60)
GFR calc non Af Amer: 32 mL/min — ABNORMAL LOW (ref >60)

## 2019-06-25 MED ORDER — HYDRALAZINE HCL 20 MG/ML IJ SOLN
10.0000 mg | Freq: Four times a day (QID) | INTRAMUSCULAR | Status: DC | PRN
  Administered 2019-06-25: 03:00:00 10 mg via INTRAVENOUS
  Filled 2019-06-25: qty 1

## 2019-06-25 MED ORDER — LACTATED RINGERS IV SOLN
INTRAVENOUS | Status: DC
  Administered 2019-06-25 – 2019-06-29 (×5): via INTRAVENOUS

## 2019-06-25 MED ORDER — AMLODIPINE BESYLATE 5 MG PO TABS
5.0000 mg | ORAL_TABLET | Freq: Every day | ORAL | Status: DC
  Administered 2019-06-25 – 2019-06-28 (×4): 5 mg via ORAL
  Filled 2019-06-25 (×4): qty 1

## 2019-06-25 MED ORDER — HYDRALAZINE HCL 10 MG PO TABS
10.0000 mg | ORAL_TABLET | Freq: Three times a day (TID) | ORAL | Status: DC | PRN
  Administered 2019-06-29: 14:00:00 10 mg via ORAL
  Filled 2019-06-25: qty 1

## 2019-06-25 NOTE — H&P (View-Only) (Signed)
Patient ID: Amber Jensen, female   DOB: 07/05/1981, 38 y.o.   MRN: [id number]    4 Days Post-Op  Subjective: No new complaints.  Took a shower yesterday and cleaned her wound out.  ROS: See above, otherwise other systems negative  Objective: Vital signs in last 24 hours: Temp:  [98.3 F (36.8 C)-99.9 F (37.7 C)] 98.8 F (37.1 C) (12/09 0825) Pulse Rate:  [98-111] 102 (12/09 0825) Resp:  [18-21] 20 (12/09 0825) BP: (152-186)/(73-103) 166/73 (12/09 0825) SpO2:  [98 %-100 %] 100 % (12/09 0825) Last BM Date: 06/23/19  Intake/Output from previous day: 12/08 0701 - 12/09 0700 In: 2794.3 [P.O.:1800; I.V.:994.3] Out: 6213 [Urine:1654] Intake/Output this shift: Total I/O In: -  Out: 650 [Urine:650]  PE: Skin: right thigh wound with some progression of superficial skin changes and some areas of necrosis.  Still with edema as expected.  Packing was much better today, but septations still able to be finger fractured deep in wound with thick tan purulent drainage returned.  This is still very foul smelling.  Superficial tissue in wound appears clean, but given location and depth of the wound I can not visualize all the way to the base.  Lab Results:  Recent Labs    06/23/19 0414 06/24/19 0542  WBC 21.9* 16.6*  HGB 11.5* 10.6*  HCT 37.2 33.8*  PLT 359 323   BMET Recent Labs    06/23/19 0414 06/24/19 0542 06/25/19 0543  NA 134* 137  --   K 3.6 3.4*  --   CL 105 108  --   CO2 16* 19*  --   GLUCOSE 173* 124*  --   BUN 40* 33*  --   CREATININE 2.19* 2.09* 1.93*  CALCIUM 8.4* 8.1*  --    PT/INR No results for input(s): LABPROT, INR in the last 72 hours. CMP     Component Value Date/Time   NA 137 06/24/2019 0542   K 3.4 (L) 06/24/2019 0542   CL 108 06/24/2019 0542   CO2 19 (L) 06/24/2019 0542   GLUCOSE 124 (H) 06/24/2019 0542   BUN 33 (H) 06/24/2019 0542   CREATININE 1.93 (H) 06/25/2019 0543   CALCIUM 8.1 (L) 06/24/2019 0542   PROT 6.2 (L) 06/24/2019 0542   ALBUMIN 2.1 (L) 06/24/2019 0542   AST 10 (L) 06/24/2019 0542   ALT 9 06/24/2019 0542   ALKPHOS 82 06/24/2019 0542   BILITOT 0.4 06/24/2019 0542   GFRNONAA 32 (L) 06/25/2019 0543   GFRAA 37 (L) 06/25/2019 0543   Lipase  No results found for: LIPASE     Studies/Results: No results found.  Anti-infectives: Anti-infectives (From admission, onward)   Start     Dose/Rate Route Frequency Ordered Stop   06/24/19 1700  vancomycin (VANCOCIN) IVPB 1000 mg/200 mL premix     1,000 mg 200 mL/hr over 60 Minutes Intravenous Daily 06/24/19 1636     06/23/19 1400  vancomycin (VANCOCIN) IVPB 1000 mg/200 mL premix     1,000 mg 200 mL/hr over 60 Minutes Intravenous  Once 06/23/19 1304 06/23/19 1524   06/22/19 1047  vancomycin variable dose per unstable renal function (pharmacist dosing)  Status:  Discontinued      Does not apply See admin instructions 06/22/19 1047 06/24/19 1636   06/21/19 2200  vancomycin (VANCOCIN) IVPB 1000 mg/200 mL premix  Status:  Discontinued     1,000 mg 200 mL/hr over 60 Minutes Intravenous Every 12 hours 06/21/19 1142 06/22/19 0827   06/21/19 1000  vancomycin (VANCOCIN)  2,500 mg in sodium chloride 0.9 % 500 mL IVPB     2,500 mg 250 mL/hr over 120 Minutes Intravenous  Once 06/21/19 0929 06/21/19 1245   06/21/19 0430  piperacillin-tazobactam (ZOSYN) IVPB 3.375 g     3.375 g 100 mL/hr over 30 Minutes Intravenous  Once 06/21/19 0427 06/21/19 0532       Assessment/Plan DM- per medicine.  HTN - per medicine  POD 4, s/p I&D of right thigh abscess, Dr. Magnus Ivan -cont NS WD dressing changes BID -given drainage noted along with superficial skin necrosis, will plan to return to OR tomorrow for washout and further debridement.  We need to be able to further visualize more of her wound to make sure it is adequately clean prior to discharge.  Patient understands and is agreeable to proceed -Cx MRSA  FEN -carb mod diet/NPO p MN VTE -lovenox ID -Vanc, at discharge can  transition to doxy   LOS: 4 days    Letha Cape , [facility] 06/25/2019, 10:29 AM Please see Amion for pager number during day hours 7:00am-4:30pm

## 2019-06-25 NOTE — Progress Notes (Signed)
PROGRESS NOTE    Amber Jensen  MOQ:[id number] DOB: October 20, 1980 DOA: 06/21/2019 PCP: System, Pcp Not In    Brief Narrative:   38 year old lady with prior h/o uncontrolled DM, hypertension, admitted for right upper medial thigh swelling, was found to have thigh abscess underwent I&D by general surgery and is currently on broad spectrum IV antibiotics.  She was also found to have an acute kidney injury. Tissue cultures growing MRSA.    Assessment & Plan:   Principal Problem:   Sepsis ([place]) Active Problems:   Abscess   DM (diabetes mellitus), secondary, uncontrolled, with renal complications ([place])   Essential hypertension   Acute renal failure (ARF) ([facility])   Sepsis.  Poa. Patient presented with persistent tachycardia and leukocytosis. Source is right thigh abscess. UA negative for UTI. CXR negative for pna. Blood cultures with NGTD but surgical wound culture with MRSA (resistant to tetracycline). Continue IV fluids and abx per below. Tachycardia is persistent but mild, continue  Monitoring.    Right thigh abscess S/p incision and drainage by general surgery  Dr Ninfa Linden on 06/21/19. Blood cultures have been negative so far but and tissue cultures positive for MRSA resistant to doxycycline, oxacillin and erythromycin but sensitive to Vancomycin and Clindamycin.  She was restarted on IV vancomycin. Pain is well controlled. She continues to have drainage from the wound and general surgery is planning for a second OR procedure with a wound wash out.    Diabetes Mellitus:  cbg's well controlled. Continue decreased dose of levemir to 20 units daily and 35 units at bedtime.    CBG (last 3)  Recent Labs    06/24/19 2056 06/25/19 0751 06/25/19 1209  GLUCAP 87 70 214*   A1c is 11.9 Continue with SSI.   Essential hypertension BP elevated today. Will start on amlodipine 5mg  po daily along with hydralazine po as needed. She is on HCTZ at home but this is currently held due to her AKI.      AKI Fe Na is 0.3, probably pre renal causes. No hypotension noted. She denies having any contrast studies recently. But her vancomycin trough is elevated.  Renal ultrasound is negative for hydronephrosis UA is negative for infection.  Continue with IV fluids at NS 100 ml/hr.  Her Scr is improving. UOP has been good.      Anemia of chronic disease/ normocytic anemia.  Anemia panel shows low iron levels. Iron supplementation added.  Hemoglobins stable around 11.    Leukocytosis Due to sepsis per above.   Improving with IV antibiotics.    Hyponatremia Improved with hydration.   Hypokalemia:  Replace as needed.   DVT prophylaxis: Lovenox Code Status: FULL CODE.  Family Communication: None at bedside.  Disposition Plan: pending clinical improvement.    Consultants:   General surgery  Procedures: S/p incision and drainage by general surgery on 06/21/2019 Antimicrobials: IV vancomycin. Subjective: She continues to have thigh pain with drainage from the wound.    Objective: Vitals:   06/25/19 0030 06/25/19 0211 06/25/19 0317 06/25/19 0825  BP: (!) 186/95 (!) 173/89 (!) 180/89 (!) 166/73  Pulse: (!) 109 98 100 (!) 102  Resp: 20  18 20   Temp: 99.5 F (37.5 C)  98.3 F (36.8 C) 98.8 F (37.1 C)  TempSrc: Oral  Oral Oral  SpO2: 100%  100% 100%  Weight:      Height:        Intake/Output Summary (Last 24 hours) at 06/25/2019 1434 Last data filed at  06/25/2019 0735 Gross per 24 hour  Intake 1354.3 ml  Output 1903 ml  Net -548.7 ml   Filed Weights   06/21/19 0925 06/23/19 1009  Weight: 133.4 kg 133 kg    Examination:  General exam: NAD, resting in bed.   Respiratory system: No respiratory distress. No wheezing.  Cardiovascular system: Mild tachycardia, S1 and S2 present.  Gastrointestinal system: abd is soft non tender non distended.   Central nervous system: alert and oriented. No hemiparesis.  Extremities: right medial thigh wound bandaged, persistent  induration with small amount of purulent drainage.  Skin: no rashes seen.  Psychiatry:. Mood is appropriate.     Data Reviewed: I have personally reviewed following labs and imaging studies  CBC: Recent Labs  Lab 06/21/19 0149 06/22/19 0300 06/23/19 0414 06/24/19 0542  WBC 22.5* 25.1* 21.9* 16.6*  NEUTROABS 17.7*  --   --   --   HGB 11.2* 10.8* 11.5* 10.6*  HCT 34.7* 34.7* 37.2 33.8*  MCV 79.0* 82.4 82.3 80.7  PLT 311 317 359 323   Basic Metabolic Panel: Recent Labs  Lab 06/21/19 0149 06/22/19 0300 06/22/19 1226 06/22/19 1750 06/23/19 0414 06/24/19 0542 06/25/19 0543  NA 127* 131*  --  130* 134* 137  --   K 3.6 4.5  --  4.2 3.6 3.4*  --   CL 95* 102  --  101 105 108  --   CO2 19* 17*  --  18* 16* 19*  --   GLUCOSE 423* 403* 539* 519* 173* 124*  --   BUN 16 27*  --  38* 40* 33*  --   CREATININE 1.04* 2.10*  --  2.46* 2.19* 2.09* 1.93*  CALCIUM 8.4* 8.0*  --  8.1* 8.4* 8.1*  --    GFR: Estimated Creatinine Clearance: 54.3 mL/min (A) (by C-G formula based on SCr of 1.93 mg/dL (H)). Liver Function Tests: Recent Labs  Lab 06/21/19 0149 06/22/19 0300 06/24/19 0542  AST 14* 15 10*  ALT 13 9 9   ALKPHOS 107 95 82  BILITOT 1.0 0.9 0.4  PROT 7.6 6.8 6.2*  ALBUMIN 3.1* 2.6* 2.1*   No results for input(s): LIPASE, AMYLASE in the last 168 hours. No results for input(s): AMMONIA in the last 168 hours. Coagulation Profile: No results for input(s): INR, PROTIME in the last 168 hours. Cardiac Enzymes: No results for input(s): CKTOTAL, CKMB, CKMBINDEX, TROPONINI in the last 168 hours. BNP (last 3 results) No results for input(s): PROBNP in the last 8760 hours. HbA1C: No results for input(s): HGBA1C in the last 72 hours. CBG: Recent Labs  Lab 06/24/19 1139 06/24/19 1648 06/24/19 2056 06/25/19 0751 06/25/19 1209  GLUCAP 131* 154* 87 70 214*   Lipid Profile: No results for input(s): CHOL, HDL, LDLCALC, TRIG, CHOLHDL, LDLDIRECT in the last 72 hours. Thyroid  Function Tests: No results for input(s): TSH, T4TOTAL, FREET4, T3FREE, THYROIDAB in the last 72 hours. Anemia Panel: Recent Labs    06/23/19 2013  VITAMINB12 1,778*  FOLATE 8.4  FERRITIN 131  TIBC 187*  IRON 21*  RETICCTPCT 1.0   Sepsis Labs: Recent Labs  Lab 06/21/19 0455 06/21/19 0658  LATICACIDVEN 0.9 0.9    Recent Results (from the past 240 hour(s))  SARS CORONAVIRUS 2 (TAT 6-24 HRS) Nasopharyngeal Nasopharyngeal Swab     Status: None   Collection Time: 06/21/19  1:50 AM   Specimen: Nasopharyngeal Swab  Result Value Ref Range Status   SARS Coronavirus 2 NEGATIVE NEGATIVE Final    Comment: (  NOTE) SARS-CoV-2 target nucleic acids are NOT DETECTED. The SARS-CoV-2 RNA is generally detectable in upper and lower respiratory specimens during the acute phase of infection. Negative results do not preclude SARS-CoV-2 infection, do not rule out co-infections with other pathogens, and should not be used as the sole basis for treatment or other patient management decisions. Negative results must be combined with clinical observations, patient history, and epidemiological information. The expected result is Negative. Fact Sheet for Patients: HairSlick.no Fact Sheet for Healthcare Providers: quierodirigir.com This test is not yet approved or cleared by the [place] FDA and  has been authorized for detection and/or diagnosis of SARS-CoV-2 by FDA under an Emergency Use Authorization (EUA). This EUA will remain  in effect (meaning this test can be used) for the duration of the COVID-19 declaration under Section 56 4(b)(1) of the Act, 21 U.S.C. section 360bbb-3(b)(1), unless the authorization is terminated or revoked sooner. Performed at [facility], [address], [place], [place] [address]   Urine culture     Status: Abnormal   Collection Time: 06/21/19  4:21 AM   Specimen: Urine, Clean Catch  Result Value Ref  Range Status   Specimen Description   Final    URINE, CLEAN CATCH Performed at [facility], [address], [place], [place] [address]    Special Requests   Final    NONE Performed at [facility], [address], [place], [place] [address]    Culture MULTIPLE SPECIES PRESENT, SUGGEST RECOLLECTION (A)  Final   Report Status 06/22/2019 FINAL  Final  Culture, blood (routine x 2)     Status: None (Preliminary result)   Collection Time: 06/21/19  4:55 AM   Specimen: BLOOD  Result Value Ref Range Status   Specimen Description   Final    BLOOD RIGHT ANTECUBITAL Performed at [facility], [address] [place], [place] [address]    Special Requests   Final    BOTTLES DRAWN AEROBIC AND ANAEROBIC Blood Culture results may not be optimal due to an excessive volume of blood received in culture bottles Performed at [facility], [address], [place], [place] [address]    Culture   Final    NO GROWTH 4 DAYS Performed at [facility], [address], [place], [place] [address]    Report Status PENDING  Incomplete  Culture, blood (routine x 2)     Status: None (Preliminary result)   Collection Time: 06/21/19  4:55 AM   Specimen: BLOOD LEFT HAND  Result Value Ref Range Status   Specimen Description   Final    BLOOD LEFT HAND Performed at [facility], [address], [place], [place] [address]    Special Requests   Final    BOTTLES DRAWN AEROBIC AND ANAEROBIC Blood Culture adequate volume Performed at [facility], [address], [place], [place] [address]    Culture   Final    NO GROWTH 4 DAYS Performed at [facility], [address] [place], [place] [address]    Report Status PENDING  Incomplete  SARS Coronavirus 2 by RT PCR (hospital order, performed in [facility] hospital lab) Nasopharyngeal Nasopharyngeal Swab     Status: None   Collection Time:  06/21/19  7:00 AM   Specimen: Nasopharyngeal Swab  Result Value Ref Range Status   SARS Coronavirus 2 NEGATIVE NEGATIVE Final    Comment: (NOTE) SARS-CoV-2 target nucleic  acids are NOT DETECTED. The SARS-CoV-2 RNA is generally detectable in upper and lower respiratory specimens during the acute phase of infection. The lowest concentration of SARS-CoV-2 viral copies this assay can detect is 250 copies / mL. A negative result does not preclude SARS-CoV-2 infection and should not be used as the sole basis for treatment or other patient management decisions.  A negative result may occur with improper specimen collection / handling, submission of specimen other than nasopharyngeal swab, presence of viral mutation(s) within the areas targeted by this assay, and inadequate number of viral copies (<250 copies / mL). A negative result must be combined with clinical observations, patient history, and epidemiological information. Fact Sheet for Patients:   BoilerBrush.com.cy Fact Sheet for Healthcare Providers: https://pope.com/ This test is not yet approved or cleared  by the [place] FDA and has been authorized for detection and/or diagnosis of SARS-CoV-2 by FDA under an Emergency Use Authorization (EUA).  This EUA will remain in effect (meaning this test can be used) for the duration of the COVID-19 declaration under Section 564(b)(1) of the Act, 21 U.S.C. section 360bbb-3(b)(1), unless the authorization is terminated or revoked sooner. Performed at [facility], [address] [place], [place] [address]   Aerobic/Anaerobic Culture (surgical/deep wound)     Status: None (Preliminary result)   Collection Time: 06/21/19  1:42 PM   Specimen: Abscess  Result Value Ref Range Status   Specimen Description   Final    ABSCESS Performed at [facility], [address], [place], [place] [address]     Special Requests   Final    NONE Performed at [facility], [address], [place], [place] [address]    Gram Stain   Final    RARE WBC PRESENT, PREDOMINANTLY PMN MODERATE GRAM POSITIVE COCCI MODERATE GRAM NEGATIVE RODS FEW GRAM POSITIVE RODS    Culture   Final    MODERATE METHICILLIN RESISTANT STAPHYLOCOCCUS AUREUS HOLDING FOR POSSIBLE ANAEROBE CULTURE REINCUBATED FOR BETTER GROWTH Performed at [facility], [address] [place], [place] [address]    Report Status PENDING  Incomplete   Organism ID, Bacteria METHICILLIN RESISTANT STAPHYLOCOCCUS AUREUS  Final      Susceptibility   Methicillin resistant staphylococcus aureus - MIC*    CIPROFLOXACIN <=0.5 SENSITIVE Sensitive     ERYTHROMYCIN >=8 RESISTANT Resistant     GENTAMICIN <=0.5 SENSITIVE Sensitive     OXACILLIN RESISTANT Resistant     TETRACYCLINE >=16 RESISTANT Resistant     VANCOMYCIN <=0.5 SENSITIVE Sensitive     TRIMETH/SULFA <=10 SENSITIVE Sensitive     CLINDAMYCIN <=0.25 SENSITIVE Sensitive     RIFAMPIN <=0.5 SENSITIVE Sensitive     Inducible Clindamycin NEGATIVE Sensitive     * MODERATE METHICILLIN RESISTANT STAPHYLOCOCCUS AUREUS  Culture, Urine     Status: Abnormal   Collection Time: 06/22/19 10:40 AM   Specimen: Urine, Clean Catch  Result Value Ref Range Status   Specimen Description   Final    URINE, CLEAN CATCH Performed at [facility], [address] [place], [place] [address]    Special Requests   Final    NONE Performed at [facility], [address] [place], [place] [address]    Culture (A)  Final    <10,000 COLONIES/mL INSIGNIFICANT GROWTH Performed at [facility], [address], [place], [place] [address]    Report Status 06/24/2019 FINAL  Final  Radiology Studies: No results found.      Scheduled Meds: . enoxaparin (LOVENOX) injection  60 mg Subcutaneous Q24H  . ferrous sulfate  325 mg Oral BID WC   . insulin aspart  0-15 Units Subcutaneous TID WC  . insulin aspart  0-5 Units Subcutaneous QHS  . insulin detemir  20 Units Subcutaneous Daily  . insulin detemir  35 Units Subcutaneous QHS  . senna-docusate  1 tablet Oral BID  . vitamin C  1,000 mg Oral Daily   Continuous Infusions: . lactated ringers 100 mL/hr at 06/25/19 1417  . vancomycin 1,000 mg (06/25/19 1013)     LOS: 4 days        Ky Barban, MD [facility]  06/25/2019, 2:34 PM

## 2019-06-25 NOTE — Progress Notes (Signed)
Patient ID: Amber Jensen, female   DOB: 07/05/1981, 38 y.o.   MRN: [id number]    4 Days Post-Op  Subjective: No new complaints.  Took a shower yesterday and cleaned her wound out.  ROS: See above, otherwise other systems negative  Objective: Vital signs in last 24 hours: Temp:  [98.3 F (36.8 C)-99.9 F (37.7 C)] 98.8 F (37.1 C) (12/09 0825) Pulse Rate:  [98-111] 102 (12/09 0825) Resp:  [18-21] 20 (12/09 0825) BP: (152-186)/(73-103) 166/73 (12/09 0825) SpO2:  [98 %-100 %] 100 % (12/09 0825) Last BM Date: 06/23/19  Intake/Output from previous day: 12/08 0701 - 12/09 0700 In: 2794.3 [P.O.:1800; I.V.:994.3] Out: 6213 [Urine:1654] Intake/Output this shift: Total I/O In: -  Out: 650 [Urine:650]  PE: Skin: right thigh wound with some progression of superficial skin changes and some areas of necrosis.  Still with edema as expected.  Packing was much better today, but septations still able to be finger fractured deep in wound with thick tan purulent drainage returned.  This is still very foul smelling.  Superficial tissue in wound appears clean, but given location and depth of the wound I can not visualize all the way to the base.  Lab Results:  Recent Labs    06/23/19 0414 06/24/19 0542  WBC 21.9* 16.6*  HGB 11.5* 10.6*  HCT 37.2 33.8*  PLT 359 323   BMET Recent Labs    06/23/19 0414 06/24/19 0542 06/25/19 0543  NA 134* 137  --   K 3.6 3.4*  --   CL 105 108  --   CO2 16* 19*  --   GLUCOSE 173* 124*  --   BUN 40* 33*  --   CREATININE 2.19* 2.09* 1.93*  CALCIUM 8.4* 8.1*  --    PT/INR No results for input(s): LABPROT, INR in the last 72 hours. CMP     Component Value Date/Time   NA 137 06/24/2019 0542   K 3.4 (L) 06/24/2019 0542   CL 108 06/24/2019 0542   CO2 19 (L) 06/24/2019 0542   GLUCOSE 124 (H) 06/24/2019 0542   BUN 33 (H) 06/24/2019 0542   CREATININE 1.93 (H) 06/25/2019 0543   CALCIUM 8.1 (L) 06/24/2019 0542   PROT 6.2 (L) 06/24/2019 0542   ALBUMIN 2.1 (L) 06/24/2019 0542   AST 10 (L) 06/24/2019 0542   ALT 9 06/24/2019 0542   ALKPHOS 82 06/24/2019 0542   BILITOT 0.4 06/24/2019 0542   GFRNONAA 32 (L) 06/25/2019 0543   GFRAA 37 (L) 06/25/2019 0543   Lipase  No results found for: LIPASE     Studies/Results: No results found.  Anti-infectives: Anti-infectives (From admission, onward)   Start     Dose/Rate Route Frequency Ordered Stop   06/24/19 1700  vancomycin (VANCOCIN) IVPB 1000 mg/200 mL premix     1,000 mg 200 mL/hr over 60 Minutes Intravenous Daily 06/24/19 1636     06/23/19 1400  vancomycin (VANCOCIN) IVPB 1000 mg/200 mL premix     1,000 mg 200 mL/hr over 60 Minutes Intravenous  Once 06/23/19 1304 06/23/19 1524   06/22/19 1047  vancomycin variable dose per unstable renal function (pharmacist dosing)  Status:  Discontinued      Does not apply See admin instructions 06/22/19 1047 06/24/19 1636   06/21/19 2200  vancomycin (VANCOCIN) IVPB 1000 mg/200 mL premix  Status:  Discontinued     1,000 mg 200 mL/hr over 60 Minutes Intravenous Every 12 hours 06/21/19 1142 06/22/19 0827   06/21/19 1000  vancomycin (VANCOCIN)  2,500 mg in sodium chloride 0.9 % 500 mL IVPB     2,500 mg 250 mL/hr over 120 Minutes Intravenous  Once 06/21/19 0929 06/21/19 1245   06/21/19 0430  piperacillin-tazobactam (ZOSYN) IVPB 3.375 g     3.375 g 100 mL/hr over 30 Minutes Intravenous  Once 06/21/19 0427 06/21/19 0532       Assessment/Plan DM- per medicine.  HTN - per medicine  POD 4, s/p I&D of right thigh abscess, Dr. Magnus Ivan -cont NS WD dressing changes BID -given drainage noted along with superficial skin necrosis, will plan to return to OR tomorrow for washout and further debridement.  We need to be able to further visualize more of her wound to make sure it is adequately clean prior to discharge.  Patient understands and is agreeable to proceed -Cx MRSA  FEN -carb mod diet/NPO p MN VTE -lovenox ID -Vanc, at discharge can  transition to doxy   LOS: 4 days    Letha Cape , [facility] 06/25/2019, 10:29 AM Please see Amion for pager number during day hours 7:00am-4:30pm

## 2019-06-26 ENCOUNTER — Encounter (HOSPITAL_COMMUNITY): Payer: Self-pay | Admitting: Internal Medicine

## 2019-06-26 ENCOUNTER — Inpatient Hospital Stay (HOSPITAL_COMMUNITY): Payer: 59 | Admitting: Certified Registered Nurse Anesthetist

## 2019-06-26 ENCOUNTER — Encounter (HOSPITAL_COMMUNITY)
Admission: EM | Disposition: A | Discharge status: Home Health | Payer: Self-pay | Source: Home / Self Care | Attending: Internal Medicine

## 2019-06-26 HISTORY — PX: INCISION AND DRAINAGE PERIRECTAL ABSCESS: SHX1804

## 2019-06-26 LAB — CULTURE, BLOOD (ROUTINE X 2)
Culture: NO GROWTH
Culture: NO GROWTH
Special Requests: ADEQUATE

## 2019-06-26 LAB — CBC
HCT: 34.2 % — ABNORMAL LOW (ref 36.0–46.0)
Hemoglobin: 10.6 g/dL — ABNORMAL LOW (ref 12.0–15.0)
MCH: 25.8 pg — ABNORMAL LOW (ref 26.0–34.0)
MCHC: 31 g/dL (ref 30.0–36.0)
MCV: 83.2 fL (ref 80.0–100.0)
Platelets: 386 10*3/uL (ref 150–400)
RBC: 4.11 MIL/uL (ref 3.87–5.11)
RDW: 13.5 % (ref 11.5–15.5)
WBC: 15.3 10*3/uL — ABNORMAL HIGH (ref 4.0–10.5)
nRBC: 0 % (ref 0.0–0.2)

## 2019-06-26 LAB — GLUCOSE, CAPILLARY
Glucose-Capillary: 60 mg/dL — ABNORMAL LOW (ref 70–99)
Glucose-Capillary: 92 mg/dL (ref 70–99)
Glucose-Capillary: 102 mg/dL — ABNORMAL HIGH (ref 70–99)
Glucose-Capillary: 72 mg/dL (ref 70–99)
Glucose-Capillary: 76 mg/dL (ref 70–99)
Glucose-Capillary: 78 mg/dL (ref 70–99)
Glucose-Capillary: 122 mg/dL — ABNORMAL HIGH (ref 70–99)

## 2019-06-26 LAB — CREATININE, SERUM
Creatinine, Ser: 1.67 mg/dL — ABNORMAL HIGH (ref 0.44–1.00)
GFR calc Af Amer: 44 mL/min — ABNORMAL LOW (ref >60)
GFR calc non Af Amer: 38 mL/min — ABNORMAL LOW (ref >60)

## 2019-06-26 LAB — SURGICAL PCR SCREEN
MRSA, PCR: POSITIVE — AB
Staphylococcus aureus: POSITIVE — AB

## 2019-06-26 SURGERY — INCISION AND DRAINAGE, ABSCESS, PERIRECTAL
Anesthesia: General | Site: Thigh | Laterality: Right

## 2019-06-26 MED ORDER — MIDAZOLAM HCL 2 MG/2ML IJ SOLN
INTRAMUSCULAR | Status: AC
  Filled 2019-06-26: qty 2

## 2019-06-26 MED ORDER — ACETAMINOPHEN 10 MG/ML IV SOLN
INTRAVENOUS | Status: AC
  Filled 2019-06-26: qty 100

## 2019-06-26 MED ORDER — FENTANYL CITRATE (PF) 100 MCG/2ML IJ SOLN
INTRAMUSCULAR | Status: AC
  Filled 2019-06-26: qty 2

## 2019-06-26 MED ORDER — ONDANSETRON HCL 4 MG/2ML IJ SOLN
INTRAMUSCULAR | Status: AC
  Filled 2019-06-26: qty 2

## 2019-06-26 MED ORDER — DEXAMETHASONE SODIUM PHOSPHATE 10 MG/ML IJ SOLN
INTRAMUSCULAR | Status: AC
  Filled 2019-06-26: qty 1

## 2019-06-26 MED ORDER — ACETAMINOPHEN 10 MG/ML IV SOLN
INTRAVENOUS | Status: DC | PRN
  Administered 2019-06-26: 1000 mg via INTRAVENOUS

## 2019-06-26 MED ORDER — ONDANSETRON HCL 4 MG/2ML IJ SOLN
INTRAMUSCULAR | Status: DC | PRN
  Administered 2019-06-26: 4 mg via INTRAVENOUS

## 2019-06-26 MED ORDER — MIDAZOLAM HCL 5 MG/5ML IJ SOLN
INTRAMUSCULAR | Status: DC | PRN
  Administered 2019-06-26: 2 mg via INTRAVENOUS

## 2019-06-26 MED ORDER — MUPIROCIN 2 % EX OINT
1.0000 "application " | TOPICAL_OINTMENT | Freq: Two times a day (BID) | CUTANEOUS | Status: DC
  Administered 2019-06-26 – 2019-06-29 (×7): 1 via NASAL
  Filled 2019-06-26 (×2): qty 22

## 2019-06-26 MED ORDER — LIDOCAINE 2% (20 MG/ML) 5 ML SYRINGE
INTRAMUSCULAR | Status: DC | PRN
  Administered 2019-06-26: 100 mg via INTRAVENOUS

## 2019-06-26 MED ORDER — DEXAMETHASONE SODIUM PHOSPHATE 10 MG/ML IJ SOLN
INTRAMUSCULAR | Status: DC | PRN
  Administered 2019-06-26: 5 mg via INTRAVENOUS

## 2019-06-26 MED ORDER — LIDOCAINE 2% (20 MG/ML) 5 ML SYRINGE
INTRAMUSCULAR | Status: AC
  Filled 2019-06-26: qty 5

## 2019-06-26 MED ORDER — PHENYLEPHRINE 40 MCG/ML (10ML) SYRINGE FOR IV PUSH (FOR BLOOD PRESSURE SUPPORT)
PREFILLED_SYRINGE | INTRAVENOUS | Status: DC | PRN
  Administered 2019-06-26: 120 ug via INTRAVENOUS

## 2019-06-26 MED ORDER — VANCOMYCIN HCL 10 G IV SOLR: 1250.0000 mg | Freq: Every day | INTRAVENOUS | Status: DC

## 2019-06-26 MED ORDER — PHENYLEPHRINE 40 MCG/ML (10ML) SYRINGE FOR IV PUSH (FOR BLOOD PRESSURE SUPPORT)
PREFILLED_SYRINGE | INTRAVENOUS | Status: AC
  Filled 2019-06-26: qty 10

## 2019-06-26 MED ORDER — CLINDAMYCIN PHOSPHATE 600 MG/50ML IV SOLN
600.0000 mg | Freq: Three times a day (TID) | INTRAVENOUS | Status: DC
  Administered 2019-06-26 – 2019-06-29 (×10): 600 mg via INTRAVENOUS
  Filled 2019-06-26 (×10): qty 50

## 2019-06-26 MED ORDER — PROPOFOL 10 MG/ML IV BOLUS
INTRAVENOUS | Status: AC
  Filled 2019-06-26: qty 40

## 2019-06-26 MED ORDER — FENTANYL CITRATE (PF) 100 MCG/2ML IJ SOLN
25.0000 ug | INTRAMUSCULAR | Status: DC | PRN
  Administered 2019-06-26: 50 ug via INTRAVENOUS

## 2019-06-26 MED ORDER — LACTATED RINGERS IV SOLN
INTRAVENOUS | Status: DC
  Administered 2019-06-26: 14:00:00 via INTRAVENOUS

## 2019-06-26 MED ORDER — PROPOFOL 10 MG/ML IV BOLUS
INTRAVENOUS | Status: DC | PRN
  Administered 2019-06-26: 200 mg via INTRAVENOUS

## 2019-06-26 SURGICAL SUPPLY — 22 items
BLADE SURG 15 STRL LF DISP TIS (BLADE) ×1 IMPLANT
BLADE SURG 15 STRL SS (BLADE) ×2
BNDG GAUZE ELAST 4 BULKY (GAUZE/BANDAGES/DRESSINGS) ×2 IMPLANT
CLEANER TIP ELECTROSURG 2X2 (MISCELLANEOUS) IMPLANT
COVER WAND RF STERILE (DRAPES) IMPLANT
DRSG PAD ABDOMINAL 8X10 ST (GAUZE/BANDAGES/DRESSINGS) ×3 IMPLANT
ELECT REM PT RETURN 15FT ADLT (MISCELLANEOUS) IMPLANT
GAUZE 4X4 16PLY RFD (DISPOSABLE) ×3 IMPLANT
GAUZE PACKING IODOFORM 1X5 (MISCELLANEOUS) IMPLANT
GAUZE SPONGE 4X4 12PLY STRL (GAUZE/BANDAGES/DRESSINGS) ×3 IMPLANT
GLOVE BIO SURGEON STRL SZ7.5 (GLOVE) ×3 IMPLANT
GOWN STRL REUS W/TWL LRG LVL3 (GOWN DISPOSABLE) ×6 IMPLANT
KIT BASIN OR (CUSTOM PROCEDURE TRAY) ×3 IMPLANT
KIT TURNOVER KIT A (KITS) IMPLANT
NS IRRIG 1000ML POUR BTL (IV SOLUTION) ×3 IMPLANT
PACK LITHOTOMY IV (CUSTOM PROCEDURE TRAY) ×3 IMPLANT
PENCIL SMOKE EVACUATOR (MISCELLANEOUS) IMPLANT
SWAB COLLECTION DEVICE MRSA (MISCELLANEOUS) ×3 IMPLANT
SWAB CULTURE ESWAB REG 1ML (MISCELLANEOUS) ×3 IMPLANT
TOWEL OR 17X26 10 PK STRL BLUE (TOWEL DISPOSABLE) ×3 IMPLANT
UNDERPAD 30X36 HEAVY ABSORB (UNDERPADS AND DIAPERS) ×3 IMPLANT
YANKAUER SUCT BULB TIP NO VENT (SUCTIONS) ×3 IMPLANT

## 2019-06-26 NOTE — Anesthesia Preprocedure Evaluation (Addendum)
Anesthesia Evaluation  Patient identified by MRN, date of birth, ID band Patient awake    Reviewed: Allergy & Precautions, NPO status , Patient's Chart, lab work & pertinent test results  Airway Mallampati: II  TM Distance: >3 FB Neck ROM: Full    Dental no notable dental hx. (+) Teeth Intact, Dental Advisory Given   Pulmonary neg pulmonary ROS,    Pulmonary exam normal breath sounds clear to auscultation       Cardiovascular hypertension, negative cardio ROS Normal cardiovascular exam Rhythm:Regular Rate:Normal     Neuro/Psych negative neurological ROS  negative psych ROS   GI/Hepatic negative GI ROS, Neg liver ROS,   Endo/Other  diabetes, Insulin Dependent, Oral Hypoglycemic AgentsMorbid obesity  Renal/GU Renal InsufficiencyRenal disease  negative genitourinary   Musculoskeletal negative musculoskeletal ROS (+)   Abdominal   Peds  Hematology  (+) Blood dyscrasia (Hgb 10.6), anemia ,   Anesthesia Other Findings   Reproductive/Obstetrics                            Anesthesia Physical Anesthesia Plan  ASA: III  Anesthesia Plan: General   Post-op Pain Management:    Induction: Intravenous  PONV Risk Score and Plan: 3 and Ondansetron, Midazolam and Treatment may vary due to age or medical condition  Airway Management Planned: LMA  Additional Equipment:   Intra-op Plan:   Post-operative Plan: Extubation in OR  Informed Consent: I have reviewed the patients History and Physical, chart, labs and discussed the procedure including the risks, benefits and alternatives for the proposed anesthesia with the patient or authorized representative who has indicated his/her understanding and acceptance.     Dental advisory given  Plan Discussed with: CRNA  Anesthesia Plan Comments:         Anesthesia Quick Evaluation

## 2019-06-26 NOTE — Op Note (Signed)
06/26/2019  3:03 PM  PATIENT:  Amber Jensen  38 y.o. female  PRE-OPERATIVE DIAGNOSIS:  abscess right thigh  POST-OPERATIVE DIAGNOSIS:  abscess right thigh  PROCEDURE:  Procedure(s): IRRIGATION AND DEBRIDEMENT right thigh  ABSCESS (Right)  SURGEON:  Surgeon(s) and Role:    Jovita Kussmaul, MD - Primary  PHYSICIAN ASSISTANT:   ASSISTANTS: none   ANESTHESIA:   general  EBL:  minimal   BLOOD ADMINISTERED:none  DRAINS: none   LOCAL MEDICATIONS USED:  NONE  SPECIMEN:  No Specimen  DISPOSITION OF SPECIMEN:  N/A  COUNTS:  YES  TOURNIQUET:  * No tourniquets in log *  DICTATION: .Dragon Dictation   After informed consent was obtained the patient was brought to the operating room and placed in the supine position on the operating table.  After adequate induction of general anesthesia the patient was moved in the lithotomy position and all pressure points were padded.  The right thigh and perirectal area was prepped with Betadine and draped in usual sterile manner.  An appropriate timeout was performed.  The patient had a previous incision and debridement of this abscess.  The abscess cavity was probed bluntly with a finger and there was a second abscess cavity that communicated with the main cavity which was located more distally and laterally.  The main cavity was connected to the second cavity sharply with the electrocautery.  The entire second cavity was unroofed and the pus was evacuated.  The dead tissue from the sidewalls of the wound was excised sharply with the electrocautery.  Once this was accomplished the wound appeared to be relatively clean and the tissue planes were intact.  There were no further areas of undrained abscess that I could identify.  Hemostasis was achieved using the Bovie electrocautery.  The wound was then packed with a moistened Kerlix gauze.  Sterile dressings were applied.  The patient tolerated the procedure well.  At the end of the case all needle  sponge and instrument counts were correct.  The patient was then awakened and taken recovery in stable condition.  PLAN OF CARE: Admit to inpatient   PATIENT DISPOSITION:  PACU - hemodynamically stable.   Delay start of Pharmacological VTE agent (>24hrs) due to surgical blood loss or risk of bleeding: no

## 2019-06-26 NOTE — Interval H&P Note (Signed)
History and Physical Interval Note:  [id number] 6:65 PM  Fotini Reha  has presented today for surgery, with the diagnosis of abscess right thigh.  The various methods of treatment have been discussed with the patient and family. After consideration of risks, benefits and other options for treatment, the patient has consented to  Procedure(s): IRRIGATION AND DEBRIDEMENT right thigh  ABSCESS (Right) as a surgical intervention.  The patient's history has been reviewed, patient examined, no change in status, stable for surgery.  I have reviewed the patient's chart and labs.  Questions were answered to the patient's satisfaction.     Autumn Messing III

## 2019-06-26 NOTE — Progress Notes (Addendum)
Pharmacy Antibiotic Note  Amber Jensen is a 38 y.o. female currently on day #5 of vancomycin for right thigh abscess.  She underwent I&D on 12/5 with plan for repeat I&D on 12/10.  Abscess culture colleted on 12/5 is positive for MRSA and prevotella bivia.  AKI noted on 12/6 with SCr doubling in 24 hr, but scr now trending down (~UOP 0.4 - 0.5 ml/kg/hr) with hydration.   Plan: - adjust vancomycin dose to 1250 mg IV q24h for renal function (est AUC 480 - monitor renal function closely - MRSA in abscess cx is resistant to doxycycline.  If to discharge patient on oral abx, consider using clindamycin.  ___________________________________________  Height: 5\' 6"  (167.6 cm) Weight: 293 lb 3.4 oz (133 kg) IBW/kg (Calculated) : 59.3  Temp (24hrs), Avg:98.5 F (36.9 C), Min:98.4 F (36.9 C), Max:98.6 F (37 C)  Recent Labs  Lab 06/21/19 0149 06/21/19 0455 06/21/19 0658 06/22/19 0300 06/22/19 0941 06/22/19 1750 06/23/19 0414 06/24/19 0542 06/24/19 1447 06/25/19 0543 06/26/19 0601  WBC 22.5*  --   --  25.1*  --   --  21.9* 16.6*  --   --  15.3*  CREATININE 1.04*  --   --  2.10*  --  2.46* 2.19* 2.09*  --  1.93* 1.67*  LATICACIDVEN  --  0.9 0.9  --   --   --   --   --   --   --   --   VANCOTROUGH  --   --   --   --  69*  --   --   --   --   --   --   VANCORANDOM  --   --   --   --   --   --  19  --  14  --   --     Estimated Creatinine Clearance: 62.8 mL/min (A) (by C-G formula based on SCr of 1.67 mg/dL (H)).    Allergies  Allergen Reactions  . Percocet [Oxycodone-Acetaminophen] Other (See Comments)    Almost passed out   . Vicodin [Hydrocodone-Acetaminophen] Other (See Comments)    Almost passed out     Antimicrobials this admission: 12/5 Zosyn x 1 12/5 Vanc >>  Dose adjustments this admission:  12/6 VR at 0941= 29 12/7 VRm = 19 at 0500, 1g x 1 at 1500 12/8 VRm = 14 at 1500 >> 1g q24  Microbiology results: 12/5 Flu A/B neg 12/5 BCx: ngtd 12/5 UCx: multiple  species, suggest recollection 12/5 Cov2: negative 12/5 Abscess: moderate prevotella bivia- beta lactamase positive, moderate MRSA (R= erytho, tetra) FINAL 12/6 ucx: insignif growth FINAL  Thank you for allowing pharmacy to be a part of this patient's care.  Lynelle Doctor 06/26/2019 10:03 AM

## 2019-06-26 NOTE — Progress Notes (Signed)
PT Cancellation Note  Patient Details Name: Amber Jensen MRN: [id number] DOB: 05/14/1981   Cancelled Treatment:     PT deferred this date - in am 2* pain level and in pm, pt for I&D.  Will follow.  [place] Pager [phone] Office [phone]    [facility] 06/26/2019, 4:59 PM

## 2019-06-26 NOTE — TOC Progression Note (Signed)
Transition of Care [facility]) - Progression Note    Patient Details  Name: Amber Jensen MRN: [id number] Date of Birth: 25-Dec-1980  [facility] CM/SW [place], LCSW Phone Number: 06/26/2019, 1:01 PM  Clinical Narrative:   CSW following patient for [facility] needs. Patient has been set up with [facility]) for [place]. Once patient is medically stable for discharge patient will need HHRN orders. CSW will continue to follow for discharge needs         Expected Discharge Plan and Services                                                 Social Determinants of Health (SDOH) Interventions    Readmission Risk Interventions No flowsheet data found.

## 2019-06-26 NOTE — Progress Notes (Signed)
MRSA surgical pcr positive. MD Hayes Ludwig notified and made aware.

## 2019-06-26 NOTE — Progress Notes (Signed)
PROGRESS NOTE    Amber Jensen  ZOX:[id number] DOB: Sep 25, 1980 DOA: 06/21/2019 PCP: System, Pcp Not In    Brief Narrative:   38 year old lady with prior h/o uncontrolled DM, hypertension, admitted for right upper medial thigh swelling, was found to have thigh abscess underwent I&D by general surgery and is currently on broad spectrum IV antibiotics.  She was also found to have an acute kidney injury. Tissue cultures growing MRSA.    Assessment & Plan:   Principal Problem:   Sepsis (HCC) Active Problems:   Abscess   DM (diabetes mellitus), secondary, uncontrolled, with renal complications (HCC)   Essential hypertension   Acute renal failure (ARF) (HCC)   Sepsis.  Poa. Patient presented with persistent tachycardia and leukocytosis. Source is right thigh abscess. UA negative for UTI. CXR negative for pna. Blood cultures with NGTD but surgical wound culture with MRSA (resistant to tetracycline) and Prevotella bivia. Continue IV fluids.  Changed antibiotics from IV Vanc to IV Clindamycin to provide coverage for both MRSA and Prevotella bivia . May transition to po clindamycin at the time of discharge.     Right thigh abscess S/p incision and drainage by general surgery  Dr Magnus IvanBlackman on 06/21/19 and again on 12/10 by Dr. Carolynne Edouardoth.  Blood cultures have been negative so far but and tissue cultures positive for MRSA resistant to doxycycline, oxacillin and erythromycin but sensitive to Vancomycin and Clindamycin.  Tissue culture now also growing Prevotella bivia. Antibiotic changed on 12/10 from IV vancomycin to IV clindamycin. Continue pain medication. Wound care as recommended per Surgery.   Diabetes Mellitus:  cbg's well controlled. Continue decreased dose of levemir to 20 units daily and 35 units at bedtime. Of note, patient had been npo today due to anticipated surgery with CBG in the 70s.   CBG (last 3)  Recent Labs    06/26/19 1110 06/26/19 1402 06/26/19 1519  GLUCAP 72 76 78   A1c  is 11.9 Continue with SSI.   Essential hypertension BP elevated but slightly better. Will continue newly started amlodipine 5mg  po daily along with hydralazine po as needed. She is on HCTZ at home but this is currently held due to her AKI.     AKI Fe Na is 0.3, probably pre renal causes. No hypotension noted. She denies having any contrast studies recently. Renal ultrasound is negative for hydronephrosis. UA is negative for infection (culture had mixed flora).  Continue with IV fluids at NS 100 ml/hr.  Her Scr is improving. UOP has been good.      Anemia of chronic disease/ normocytic anemia.  Anemia panel shows low iron levels. Iron supplementation added.  Hemoglobins stable around 10-11.    Leukocytosis Due to sepsis per above.   Improving with IV antibiotics.    Hyponatremia Improved with hydration.   Hypokalemia:  Replace as needed.   DVT prophylaxis: Lovenox Code Status: FULL CODE.  Family Communication: None at bedside.  Disposition Plan: pending clinical improvement.    Consultants:   General surgery  Procedures: S/p incision and drainage by general surgery on 06/21/2019 Antimicrobials: IV vancomycin. Subjective: Patient seen and examined prior to her surgery.  She continues to have thigh wound drainage. Having another I&D this afternoon.   Objective: Vitals:   06/26/19 1352 06/26/19 1516 06/26/19 1530 06/26/19 1545  BP: (!) 173/87 (!) 156/91 (!) 151/94 (!) 157/88  Pulse: 95 (!) 103 95 89  Resp: 18 (!) 22 19 19   Temp: 98.2 F (36.8 C) 98.4 F (36.9  C)    TempSrc: Oral     SpO2:  97% 98% 94%  Weight:      Height:        Intake/Output Summary (Last 24 hours) at 06/26/2019 1553 Last data filed at 06/26/2019 1519 Gross per 24 hour  Intake 2774.62 ml  Output 639 ml  Net 2135.62 ml   Filed Weights   06/21/19 0925 06/23/19 1009  Weight: 133.4 kg 133 kg    Examination:  General exam: NAD, resting in bed.   Respiratory system: No respiratory  distress. No wheezing.  Cardiovascular system: Mild tachycardia, S1 and S2 present.  Gastrointestinal system: abd is soft non tender non distended.   Central nervous system: alert and oriented. No hemiparesis.  Extremities: right medial thigh wound with tenderness to palpation Skin: no rashes seen.  Psychiatry:. Mood is appropriate.     Data Reviewed: I have personally reviewed following labs and imaging studies  CBC: Recent Labs  Lab 06/21/19 0149 06/22/19 0300 06/23/19 0414 06/24/19 0542 06/26/19 0601  WBC 22.5* 25.1* 21.9* 16.6* 15.3*  NEUTROABS 17.7*  --   --   --   --   HGB 11.2* 10.8* 11.5* 10.6* 10.6*  HCT 34.7* 34.7* 37.2 33.8* 34.2*  MCV 79.0* 82.4 82.3 80.7 83.2  PLT 311 317 359 323 386   Basic Metabolic Panel: Recent Labs  Lab 06/21/19 0149 06/22/19 0300 06/22/19 1226 06/22/19 1750 06/23/19 0414 06/24/19 0542 06/25/19 0543 06/26/19 0601  NA 127* 131*  --  130* 134* 137  --   --   K 3.6 4.5  --  4.2 3.6 3.4*  --   --   CL 95* 102  --  101 105 108  --   --   CO2 19* 17*  --  18* 16* 19*  --   --   GLUCOSE 423* 403* 539* 519* 173* 124*  --   --   BUN 16 27*  --  38* 40* 33*  --   --   CREATININE 1.04* 2.10*  --  2.46* 2.19* 2.09* 1.93* 1.67*  CALCIUM 8.4* 8.0*  --  8.1* 8.4* 8.1*  --   --    GFR: Estimated Creatinine Clearance: 62.8 mL/min (A) (by C-G formula based on SCr of 1.67 mg/dL (H)). Liver Function Tests: Recent Labs  Lab 06/21/19 0149 06/22/19 0300 06/24/19 0542  AST 14* 15 10*  ALT ALKPHOS 107 95 82  BILITOT 1.0 0.9 0.4  PROT 7.6 6.8 6.2*  ALBUMIN 3.1* 2.6* 2.1*   No results for input(s): LIPASE, AMYLASE in the last 168 hours. No results for input(s): AMMONIA in the last 168 hours. Coagulation Profile: No results for input(s): INR, PROTIME in the last 168 hours. Cardiac Enzymes: No results for input(s): CKTOTAL, CKMB, CKMBINDEX, TROPONINI in the last 168 hours. BNP (last 3 results) No results for input(s): PROBNP in the  last 8760 hours. HbA1C: No results for input(s): HGBA1C in the last 72 hours. CBG: Recent Labs  Lab 06/26/19 0646 06/26/19 0748 06/26/19 1110 06/26/19 1402 06/26/19 1519  GLUCAP 92 102* 72 76 78   Lipid Profile: No results for input(s): CHOL, HDL, LDLCALC, TRIG, CHOLHDL, LDLDIRECT in the last 72 hours. Thyroid Function Tests: No results for input(s): TSH, T4TOTAL, FREET4, T3FREE, THYROIDAB in the last 72 hours. Anemia Panel: Recent Labs    06/23/19 2013  VITAMINB12 1,778*  FOLATE 8.4  FERRITIN 131  TIBC 187*  IRON 21*  RETICCTPCT 1.0   Sepsis  Labs: Recent Labs  Lab 06/21/19 0455 06/21/19 0658  LATICACIDVEN 0.9 0.9    Recent Results (from the past 240 hour(s))  SARS CORONAVIRUS 2 (TAT 6-24 HRS) Nasopharyngeal Nasopharyngeal Swab     Status: None   Collection Time: 06/21/19  1:50 AM   Specimen: Nasopharyngeal Swab  Result Value Ref Range Status   SARS Coronavirus 2 NEGATIVE NEGATIVE Final    Comment: (NOTE) SARS-CoV-2 target nucleic acids are NOT DETECTED. The SARS-CoV-2 RNA is generally detectable in upper and lower respiratory specimens during the acute phase of infection. Negative results do not preclude SARS-CoV-2 infection, do not rule out co-infections with other pathogens, and should not be used as the sole basis for treatment or other patient management decisions. Negative results must be combined with clinical observations, patient history, and epidemiological information. The expected result is Negative. Fact Sheet for Patients: HairSlick.no Fact Sheet for Healthcare Providers: quierodirigir.com This test is not yet approved or cleared by the [place] FDA and  has been authorized for detection and/or diagnosis of SARS-CoV-2 by FDA under an Emergency Use Authorization (EUA). This EUA will remain  in effect (meaning this test can be used) for the duration of the COVID-19 declaration under  Section 56 4(b)(1) of the Act, 21 U.S.C. section 360bbb-3(b)(1), unless the authorization is terminated or revoked sooner. Performed at [facility], [address] [place], [place] [address]   Urine culture     Status: Abnormal   Collection Time: 06/21/19  4:21 AM   Specimen: Urine, Clean Catch  Result Value Ref Range Status   Specimen Description   Final    URINE, CLEAN CATCH Performed at [facility], [address], [place], [place] [address]    Special Requests   Final    NONE Performed at [facility], [address], [place], [place] [address]    Culture MULTIPLE SPECIES PRESENT, SUGGEST RECOLLECTION (A)  Final   Report Status 06/22/2019 FINAL  Final  Culture, blood (routine x 2)     Status: None   Collection Time: 06/21/19  4:55 AM   Specimen: BLOOD  Result Value Ref Range Status   Specimen Description   Final    BLOOD RIGHT ANTECUBITAL Performed at [facility], [address] [place], [place] [address]    Special Requests   Final    BOTTLES DRAWN AEROBIC AND ANAEROBIC Blood Culture results may not be optimal due to an excessive volume of blood received in culture bottles Performed at [facility], [address] [place], [place] [address]    Culture   Final    NO GROWTH 5 DAYS Performed at [facility], [address], [place], [place] [address]    Report Status 06/26/2019 FINAL  Final  Culture, blood (routine x 2)     Status: None   Collection Time: 06/21/19  4:55 AM   Specimen: BLOOD LEFT HAND  Result Value Ref Range Status   Specimen Description   Final    BLOOD LEFT HAND Performed at [facility], [address] [place], [place] [address]    Special Requests   Final    BOTTLES DRAWN AEROBIC AND ANAEROBIC Blood Culture adequate volume Performed at [facility], [address] [place], [place] [address]    Culture   Final     NO GROWTH 5 DAYS Performed at [facility], [address], [place], [place] [address]  Report Status 06/26/2019 FINAL  Final  SARS Coronavirus 2 by RT PCR (hospital order, performed in [facility] hospital lab) Nasopharyngeal Nasopharyngeal Swab     Status: None   Collection Time: 06/21/19  7:00 AM   Specimen: Nasopharyngeal Swab  Result Value Ref Range Status   SARS Coronavirus 2 NEGATIVE NEGATIVE Final    Comment: (NOTE) SARS-CoV-2 target nucleic acids are NOT DETECTED. The SARS-CoV-2 RNA is generally detectable in upper and lower respiratory specimens during the acute phase of infection. The lowest concentration of SARS-CoV-2 viral copies this assay can detect is 250 copies / mL. A negative result does not preclude SARS-CoV-2 infection and should not be used as the sole basis for treatment or other patient management decisions.  A negative result may occur with improper specimen collection / handling, submission of specimen other than nasopharyngeal swab, presence of viral mutation(s) within the areas targeted by this assay, and inadequate number of viral copies (<250 copies / mL). A negative result must be combined with clinical observations, patient history, and epidemiological information. Fact Sheet for Patients:   StrictlyIdeas.no Fact Sheet for Healthcare Providers: BankingDealers.co.za This test is not yet approved or cleared  by the [place] FDA and has been authorized for detection and/or diagnosis of SARS-CoV-2 by FDA under an Emergency Use Authorization (EUA).  This EUA will remain in effect (meaning this test can be used) for the duration of the COVID-19 declaration under Section 564(b)(1) of the Act, 21 U.S.C. section 360bbb-3(b)(1), unless the authorization is terminated or revoked sooner. Performed at [facility], [place] [address], [place], [place] [address]   Aerobic/Anaerobic Culture  (surgical/deep wound)     Status: None   Collection Time: 06/21/19  1:42 PM   Specimen: Abscess  Result Value Ref Range Status   Specimen Description   Final    ABSCESS Performed at [place] [address], [place], [place] [address]    Special Requests   Final    NONE Performed at [facility], [place] [address] [place], [place] [address]    Gram Stain   Final    RARE WBC PRESENT, PREDOMINANTLY PMN MODERATE GRAM POSITIVE COCCI MODERATE GRAM NEGATIVE RODS FEW GRAM POSITIVE RODS    Culture   Final    MODERATE METHICILLIN RESISTANT STAPHYLOCOCCUS AUREUS MODERATE PREVOTELLA BIVIA BETA LACTAMASE POSITIVE Performed at [facility], [place] [address], [place], [place] [address]    Report Status 06/25/2019 FINAL  Final   Organism ID, Bacteria METHICILLIN RESISTANT STAPHYLOCOCCUS AUREUS  Final      Susceptibility   Methicillin resistant staphylococcus aureus - MIC*    CIPROFLOXACIN <=0.5 SENSITIVE Sensitive     ERYTHROMYCIN >=8 RESISTANT Resistant     GENTAMICIN <=0.5 SENSITIVE Sensitive     OXACILLIN RESISTANT Resistant     TETRACYCLINE >=16 RESISTANT Resistant     VANCOMYCIN <=0.5 SENSITIVE Sensitive     TRIMETH/SULFA <=10 SENSITIVE Sensitive     CLINDAMYCIN <=0.25 SENSITIVE Sensitive     RIFAMPIN <=0.5 SENSITIVE Sensitive     Inducible Clindamycin NEGATIVE Sensitive     * MODERATE METHICILLIN RESISTANT STAPHYLOCOCCUS AUREUS  Culture, Urine     Status: Abnormal   Collection Time: 06/22/19 10:40 AM   Specimen: Urine, Clean Catch  Result Value Ref Range Status   Specimen Description   Final    URINE, CLEAN CATCH Performed at [facility], [place] [address] [place], [place] [address]    Special Requests  Final    NONE Performed at [facility], [address] [place], [place] [address]    Culture (A)  Final    <10,000 COLONIES/mL INSIGNIFICANT GROWTH Performed at [facility], [address], [place], [place] [address]    Report Status 06/24/2019 FINAL  Final         Radiology Studies: No results found.      Scheduled Meds: . [MAR Hold] amLODipine  5 mg Oral Daily  . [MAR Hold] enoxaparin (LOVENOX) injection  60 mg Subcutaneous Q24H  . fentaNYL      . [MAR Hold] ferrous sulfate  325 mg Oral BID WC  . [MAR Hold] insulin aspart  0-15 Units Subcutaneous TID WC  . [MAR Hold] insulin aspart  0-5 Units Subcutaneous QHS  . [MAR Hold] insulin detemir  20 Units Subcutaneous Daily  . [MAR Hold] insulin detemir  35 Units Subcutaneous QHS  . [MAR Hold] mupirocin ointment  1 application Nasal BID  . [MAR Hold] senna-docusate  1 tablet Oral BID  . [MAR Hold] vitamin C  1,000 mg Oral Daily   Continuous Infusions: . [MAR Hold] clindamycin (CLEOCIN) IV 600 mg (06/26/19 1120)  . lactated ringers 100 mL/hr at 06/26/19 0300  . lactated ringers 75 mL/hr at 06/26/19 1420     LOS: 5 days        Ky Barban, MD [facility]  06/26/2019, 3:53 PM

## 2019-06-26 NOTE — Anesthesia Postprocedure Evaluation (Signed)
Anesthesia Post Note  Patient: Amber Jensen  Procedure(s) Performed: IRRIGATION AND DEBRIDEMENT right thigh  ABSCESS (Right Thigh)     Patient location during evaluation: PACU Anesthesia Type: General Level of consciousness: awake and alert Pain management: pain level controlled Vital Signs Assessment: post-procedure vital signs reviewed and stable Respiratory status: spontaneous breathing, nonlabored ventilation, respiratory function stable and patient connected to nasal cannula oxygen Cardiovascular status: blood pressure returned to baseline and stable Postop Assessment: no apparent nausea or vomiting Anesthetic complications: no    Last Vitals:  Vitals:   06/26/19 1545 06/26/19 1600  BP: (!) 157/88 (!) 156/95  Pulse: 89 92  Resp: 19 15  Temp:  36.6 C  SpO2: 94% 94%    Last Pain:  Vitals:   06/26/19 1600  TempSrc:   PainSc: Asleep                 [name]

## 2019-06-26 NOTE — Anesthesia Procedure Notes (Signed)
Procedure Name: LMA Insertion Date/Time: 06/26/2019 2:41 PM Performed by: West Pugh, CRNA Pre-anesthesia Checklist: Patient identified, Emergency Drugs available, Suction available, Patient being monitored and Timeout performed Patient Re-evaluated:Patient Re-evaluated prior to induction Oxygen Delivery Method: Circle system utilized Preoxygenation: Pre-oxygenation with 100% oxygen Induction Type: IV induction LMA: LMA with gastric port inserted LMA Size: 4.0 Number of attempts: 1 Placement Confirmation: positive ETCO2 and breath sounds checked- equal and bilateral Tube secured with: Tape Dental Injury: Teeth and Oropharynx as per pre-operative assessment

## 2019-06-26 NOTE — Transfer of Care (Signed)
Immediate Anesthesia Transfer of Care Note  Patient: Amber Jensen  Procedure(s) Performed: IRRIGATION AND DEBRIDEMENT right thigh  ABSCESS (Right Thigh)  Patient Location: PACU  Anesthesia Type:General  Level of Consciousness: awake, drowsy and patient cooperative  Airway & Oxygen Therapy: Patient Spontanous Breathing and Patient connected to face mask oxygen  Post-op Assessment: Report given to RN and Post -op Vital signs reviewed and stable  Post vital signs: Reviewed and stable  Last Vitals:  Vitals Value Taken Time  BP 156/91 06/26/19 1516  Temp    Pulse 97 06/26/19 1519  Resp 23 06/26/19 1519  SpO2 99 % 06/26/19 1519  Vitals shown include unvalidated device data.  Last Pain:  Vitals:   06/26/19 1352  TempSrc: Oral  PainSc:       Patients Stated Pain Goal: 2 (25/49/82 6415)  Complications: No apparent anesthesia complications

## 2019-06-27 LAB — BASIC METABOLIC PANEL
Anion gap: 14 (ref 5–15)
BUN: 15 mg/dL (ref 6–20)
CO2: 22 mmol/L (ref 22–32)
Calcium: 8.3 mg/dL — ABNORMAL LOW (ref 8.9–10.3)
Chloride: 102 mmol/L (ref 98–111)
Creatinine, Ser: 1.72 mg/dL — ABNORMAL HIGH (ref 0.44–1.00)
GFR calc Af Amer: 42 mL/min — ABNORMAL LOW (ref >60)
GFR calc non Af Amer: 37 mL/min — ABNORMAL LOW (ref >60)
Glucose, Bld: 319 mg/dL — ABNORMAL HIGH (ref 70–99)
Potassium: 4.7 mmol/L (ref 3.5–5.1)
Sodium: 138 mmol/L (ref 135–145)

## 2019-06-27 LAB — CBC
HCT: 31.1 % — ABNORMAL LOW (ref 36.0–46.0)
Hemoglobin: 10.3 g/dL — ABNORMAL LOW (ref 12.0–15.0)
MCH: 25.8 pg — ABNORMAL LOW (ref 26.0–34.0)
MCHC: 33.1 g/dL (ref 30.0–36.0)
MCV: 77.9 fL — ABNORMAL LOW (ref 80.0–100.0)
Platelets: 379 10*3/uL (ref 150–400)
RBC: 3.99 MIL/uL (ref 3.87–5.11)
RDW: 13.2 % (ref 11.5–15.5)
WBC: 12.8 10*3/uL — ABNORMAL HIGH (ref 4.0–10.5)
nRBC: 0.2 % (ref 0.0–0.2)

## 2019-06-27 LAB — GLUCOSE, CAPILLARY
Glucose-Capillary: 273 mg/dL — ABNORMAL HIGH (ref 70–99)
Glucose-Capillary: 186 mg/dL — ABNORMAL HIGH (ref 70–99)
Glucose-Capillary: 166 mg/dL — ABNORMAL HIGH (ref 70–99)
Glucose-Capillary: 89 mg/dL (ref 70–99)

## 2019-06-27 MED ORDER — LIVING WELL WITH DIABETES BOOK
Freq: Once | Status: AC
  Administered 2019-06-27: 20:00:00
  Filled 2019-06-27: qty 1

## 2019-06-27 MED ORDER — CHLORHEXIDINE GLUCONATE CLOTH 2 % EX PADS
6.0000 | MEDICATED_PAD | Freq: Every day | CUTANEOUS | Status: DC
  Administered 2019-06-28 – 2019-06-29 (×2): 6 via TOPICAL

## 2019-06-27 MED ORDER — TRAMADOL HCL 50 MG PO TABS: 50.0000 mg | ORAL_TABLET | Freq: Four times a day (QID) | ORAL | 0 refills | Status: AC | PRN

## 2019-06-27 MED ORDER — ACETAMINOPHEN 325 MG PO TABS: 650.0000 mg | ORAL_TABLET | Freq: Four times a day (QID) | ORAL | Status: AC | PRN

## 2019-06-27 NOTE — Progress Notes (Signed)
Will continue Bactroban per protocol for positive MRSA pcr per infection prevention recommendations.

## 2019-06-27 NOTE — Progress Notes (Signed)
PROGRESS NOTE    Amber Jensen  OZH:[id number] DOB: 1981-06-15 DOA: 06/21/2019 PCP: System, Pcp Not In    Brief Narrative:   38 year old lady with prior h/o uncontrolled DM, hypertension, admitted for right upper medial thigh swelling, was found to have thigh abscess underwent I&D by general surgery and is currently on broad spectrum IV antibiotics.  She was also found to have an acute kidney injury. Tissue cultures growing MRSA.    Assessment & Plan:   Principal Problem:   Sepsis (HCC) Active Problems:   Abscess   DM (diabetes mellitus), secondary, uncontrolled, with renal complications (HCC)   Essential hypertension   Acute renal failure (ARF) (HCC)   Sepsis.  Poa. Patient presented with persistent tachycardia and leukocytosis. Source is right thigh abscess. UA negative for UTI (had contaminant but patient asymptomatic). CXR negative for pna. Blood cultures with NGTD but surgical wound culture with MRSA (resistant to tetracycline) and Prevotella bivia. Continue IV fluids.  Changed antibiotics from IV Vanc to IV Clindamycin to provide coverage for both MRSA and Prevotella bivia . May transition to po clindamycin at the time of discharge.     Right thigh abscess S/p incision and drainage by general surgery  Dr Magnus Ivan on 06/21/19 and again on 12/10 by Dr. Carolynne Edouard.  Blood cultures have been negative so far but tissue cultures positive for MRSA resistant to doxycycline, oxacillin and erythromycin but sensitive to Vancomycin and Clindamycin.  Tissue culture now also growing Prevotella bivia. Antibiotic changed on 12/10 from IV vancomycin to IV clindamycin. Continue pain medication. Wound care as recommended per Surgery.   Diabetes Mellitus:  cbg's trended up today but likely due to post-operative state.  -Continue decreased dose of levemir to 20 units daily and 35 units at bedtime. Adjust insulin as needed. Will need better glycemic control at home to promote wound healing.   CBG (last  3)  Recent Labs    06/27/19 0713 06/27/19 1205 06/27/19 1621  GLUCAP 273* 186* 166*   A1c is 11.9 Continue with SSI.   Essential hypertension BP elevated but slightly better. Will continue newly started amlodipine  po daily along with hydralazine po as needed. She is on HCTZ at home but this is currently held due to her AKI.     AKI Fe Na is 0.3, probably pre renal causes. No hypotension noted. She denies having any contrast studies recently. Renal ultrasound is negative for hydronephrosis. UA is negative for infection (culture had mixed flora).  Continue with IV fluids at NS 100 ml/hr.  Her Scr is improving but still not at her baseline. UOP has been good.   Repeat Labs in am.     Anemia of chronic disease/ normocytic anemia.  Anemia panel shows low iron levels. Iron supplementation added.  Hemoglobins stable around 10-11.    Leukocytosis Due to sepsis per above.   Improving with IV antibiotics.    Hyponatremia Improved with hydration.   Hypokalemia:  Replace as needed.    DVT prophylaxis: Lovenox Code Status: FULL CODE.  Family Communication: None at bedside.  Disposition Plan: pending improvement of renal function.    Consultants:   General surgery  Procedures: S/p incision and drainage by general surgery on 06/21/2019 and on 12/10 Antimicrobials: IV vancomycin. Subjective: Patient seen and examined earlier this afternoon. Her right thigh wound is improving with less drainage after her second I&D, and with not much pain. Denies fever. Had headache due to not sleeping well last night but this was improving  with Tylenol.   Objective: Vitals:   06/26/19 2045 06/27/19 0553 06/27/19 1100 06/27/19 1322  BP: (!) 155/91 (!) 155/92 (!) 145/81 (!) 153/83  Pulse: 90 88 98 89  Resp: 16 16  18   Temp: 97.8 F (36.6 C) 98.3 F (36.8 C)  97.8 F (36.6 C)  TempSrc: Oral Oral  Oral  SpO2: 100% 96%  97%  Weight:      Height:        Intake/Output Summary (Last  24 hours) at 06/27/2019 1804 Last data filed at 06/27/2019 0500 Gross per 24 hour  Intake 100 ml  Output 620 ml  Net -520 ml   Filed Weights   06/21/19 0925 06/23/19 1009  Weight: 133.4 kg 133 kg    Examination:  General exam: NAD, resting in bed.   Respiratory system: No respiratory distress. No wheezing.  Cardiovascular system: RRR. S1 and S2 present.  Gastrointestinal system: abd is soft, non tender, non distended.   Central nervous system: alert and oriented x3. No hemiparesis.  Extremities: right medial thigh wound with tenderness to palpation but no induration, surgical wound is covered with bandage that is c/d/i.  Skin: no rashes seen.  Psychiatry:. Mood is appropriate.     Data Reviewed: I have personally reviewed following labs and imaging studies  CBC: Recent Labs  Lab 06/21/19 0149 06/22/19 0300 06/23/19 0414 06/24/19 0542 06/26/19 0601 06/27/19 0603  WBC 22.5* 25.1* 21.9* 16.6* 15.3* 12.8*  NEUTROABS 17.7*  --   --   --   --   --   HGB 11.2* 10.8* 11.5* 10.6* 10.6* 10.3*  HCT 34.7* 34.7* 37.2 33.8* 34.2* 31.1*  MCV 79.0* 82.4 82.3 80.7 83.2 77.9*  PLT 311 317 359 323 386 379   Basic Metabolic Panel: Recent Labs  Lab 06/22/19 0300 06/22/19 1226 06/22/19 1750 06/23/19 0414 06/24/19 0542 06/25/19 0543 06/26/19 0601 06/27/19 0603  NA 131*  --  130* 134* 137  --   --  138  K 4.5  --  4.2 3.6 3.4*  --   --  4.7  CL 102  --  101 105 108  --   --  102  CO2 17*  --  18* 16* 19*  --   --  22  GLUCOSE 403* 539* 519* 173* 124*  --   --  319*  BUN 27*  --  38* 40* 33*  --   --  15  CREATININE 2.10*  --  2.46* 2.19* 2.09* 1.93* 1.67* 1.72*  CALCIUM 8.0*  --  8.1* 8.4* 8.1*  --   --  8.3*   GFR: Estimated Creatinine Clearance: 60.9 mL/min (A) (by C-G formula based on SCr of 1.72 mg/dL (H)). Liver Function Tests: Recent Labs  Lab 06/21/19 0149 06/22/19 0300 06/24/19 0542  AST 14* 15 10*  ALT 13 9 9   ALKPHOS 107 95 82  BILITOT 1.0 0.9 0.4  PROT  7.6 6.8 6.2*  ALBUMIN 3.1* 2.6* 2.1*   No results for input(s): LIPASE, AMYLASE in the last 168 hours. No results for input(s): AMMONIA in the last 168 hours. Coagulation Profile: No results for input(s): INR, PROTIME in the last 168 hours. Cardiac Enzymes: No results for input(s): CKTOTAL, CKMB, CKMBINDEX, TROPONINI in the last 168 hours. BNP (last 3 results) No results for input(s): PROBNP in the last 8760 hours. HbA1C: No results for input(s): HGBA1C in the last 72 hours. CBG: Recent Labs  Lab 06/26/19 1519 06/26/19 1729 06/27/19 0713 06/27/19 1205 06/27/19 1621  GLUCAP 78 122* 273* 186* 166*   Lipid Profile: No results for input(s): CHOL, HDL, LDLCALC, TRIG, CHOLHDL, LDLDIRECT in the last 72 hours. Thyroid Function Tests: No results for input(s): TSH, T4TOTAL, FREET4, T3FREE, THYROIDAB in the last 72 hours. Anemia Panel: No results for input(s): VITAMINB12, FOLATE, FERRITIN, TIBC, IRON, RETICCTPCT in the last 72 hours. Sepsis Labs: Recent Labs  Lab 06/21/19 0455 06/21/19 0658  LATICACIDVEN 0.9 0.9    Recent Results (from the past 240 hour(s))  SARS CORONAVIRUS 2 (TAT 6-24 HRS) Nasopharyngeal Nasopharyngeal Swab     Status: None   Collection Time: 06/21/19  1:50 AM   Specimen: Nasopharyngeal Swab  Result Value Ref Range Status   SARS Coronavirus 2 NEGATIVE NEGATIVE Final    Comment: (NOTE) SARS-CoV-2 target nucleic acids are NOT DETECTED. The SARS-CoV-2 RNA is generally detectable in upper and lower respiratory specimens during the acute phase of infection. Negative results do not preclude SARS-CoV-2 infection, do not rule out co-infections with other pathogens, and should not be used as the sole basis for treatment or other patient management decisions. Negative results must be combined with clinical observations, patient history, and epidemiological information. The expected result is Negative. Fact Sheet for  Patients: SugarRoll.be Fact Sheet for Healthcare Providers: https://www.woods-mathews.com/ This test is not yet approved or cleared by the [place] FDA and  has been authorized for detection and/or diagnosis of SARS-CoV-2 by FDA under an Emergency Use Authorization (EUA). This EUA will remain  in effect (meaning this test can be used) for the duration of the COVID-19 declaration under Section 56 4(b)(1) of the Act, 21 U.S.C. section 360bbb-3(b)(1), unless the authorization is terminated or revoked sooner. Performed at [facility], [place] [address], [place], [place] [address]   Urine culture     Status: Abnormal   Collection Time: 06/21/19  4:21 AM   Specimen: Urine, Clean Catch  Result Value Ref Range Status   Specimen Description   Final    URINE, CLEAN CATCH Performed at [facility], [place] [address], [place], [place] [address]    Special Requests   Final    NONE Performed at [facility], [place] [address], [place], [place] [address]    Culture MULTIPLE SPECIES PRESENT, SUGGEST RECOLLECTION (A)  Final   Report Status 06/22/2019 FINAL  Final  Culture, blood (routine x 2)     Status: None   Collection Time: 06/21/19  4:55 AM   Specimen: BLOOD  Result Value Ref Range Status   Specimen Description   Final    BLOOD RIGHT ANTECUBITAL Performed at [place] [address] [place], [place] [address]    Special Requests   Final    BOTTLES DRAWN AEROBIC AND ANAEROBIC Blood Culture results may not be optimal due to an excessive volume of blood received in culture bottles Performed at [place] [address], [place], [place] [address]    Culture   Final    NO GROWTH 5 DAYS Performed at [facility], [place] [address], [place], [place] [address]    Report Status 06/26/2019 FINAL  Final  Culture, blood (routine x 2)     Status: None   Collection  Time: 06/21/19  4:55 AM   Specimen: BLOOD LEFT HAND  Result Value Ref Range Status   Specimen Description   Final    BLOOD LEFT HAND Performed at [place] [address], [place], [place] [address]  Special Requests   Final    BOTTLES DRAWN AEROBIC AND ANAEROBIC Blood Culture adequate volume Performed at [facility], [address], [place], [place] [address]    Culture   Final    NO GROWTH 5 DAYS Performed at [facility], [address] [place], [place] [address]    Report Status 06/26/2019 FINAL  Final  SARS Coronavirus 2 by RT PCR (hospital order, performed in [facility] hospital lab) Nasopharyngeal Nasopharyngeal Swab     Status: None   Collection Time: 06/21/19  7:00 AM   Specimen: Nasopharyngeal Swab  Result Value Ref Range Status   SARS Coronavirus 2 NEGATIVE NEGATIVE Final    Comment: (NOTE) SARS-CoV-2 target nucleic acids are NOT DETECTED. The SARS-CoV-2 RNA is generally detectable in upper and lower respiratory specimens during the acute phase of infection. The lowest concentration of SARS-CoV-2 viral copies this assay can detect is 250 copies / mL. A negative result does not preclude SARS-CoV-2 infection and should not be used as the sole basis for treatment or other patient management decisions.  A negative result may occur with improper specimen collection / handling, submission of specimen other than nasopharyngeal swab, presence of viral mutation(s) within the areas targeted by this assay, and inadequate number of viral copies (<250 copies / mL). A negative result must be combined with clinical observations, patient history, and epidemiological information. Fact Sheet for Patients:   BoilerBrush.com.cy Fact Sheet for Healthcare Providers: https://pope.com/ This test is not yet approved or cleared  by the [place] FDA and has been authorized for detection  and/or diagnosis of SARS-CoV-2 by FDA under an Emergency Use Authorization (EUA).  This EUA will remain in effect (meaning this test can be used) for the duration of the COVID-19 declaration under Section 564(b)(1) of the Act, 21 U.S.C. section 360bbb-3(b)(1), unless the authorization is terminated or revoked sooner. Performed at [facility] [address], [place], [place] [address]   Aerobic/Anaerobic Culture (surgical/deep wound)     Status: None   Collection Time: 06/21/19  1:42 PM   Specimen: Abscess  Result Value Ref Range Status   Specimen Description   Final    ABSCESS Performed at [facility], [address], [place], [place] [address]    Special Requests   Final    NONE Performed at [facility], [address] [place], [place] [address]    Gram Stain   Final    RARE WBC PRESENT, PREDOMINANTLY PMN MODERATE GRAM POSITIVE COCCI MODERATE GRAM NEGATIVE RODS FEW GRAM POSITIVE RODS    Culture   Final    MODERATE METHICILLIN RESISTANT STAPHYLOCOCCUS AUREUS MODERATE PREVOTELLA BIVIA BETA LACTAMASE POSITIVE Performed at [facility], [address], [place], [place] [address]    Report Status 06/25/2019 FINAL  Final   Organism ID, Bacteria METHICILLIN RESISTANT STAPHYLOCOCCUS AUREUS  Final      Susceptibility   Methicillin resistant staphylococcus aureus - MIC*    CIPROFLOXACIN <=0.5 SENSITIVE Sensitive     ERYTHROMYCIN >=8 RESISTANT Resistant     GENTAMICIN <=0.5 SENSITIVE Sensitive     OXACILLIN RESISTANT Resistant     TETRACYCLINE >=16 RESISTANT Resistant     VANCOMYCIN <=0.5 SENSITIVE Sensitive     TRIMETH/SULFA <=10 SENSITIVE Sensitive     CLINDAMYCIN <=0.25 SENSITIVE Sensitive     RIFAMPIN <=0.5 SENSITIVE Sensitive     Inducible Clindamycin NEGATIVE Sensitive     * MODERATE METHICILLIN RESISTANT STAPHYLOCOCCUS  AUREUS  Culture, Urine     Status: Abnormal   Collection Time: 06/22/19 10:40 AM    Specimen: Urine, Clean Catch  Result Value Ref Range Status   Specimen Description   Final    URINE, CLEAN CATCH Performed at [facility], [address], [place], [place] [address]    Special Requests   Final    NONE Performed at [facility], [address] [place], [place] [address]    Culture (A)  Final    <10,000 COLONIES/mL INSIGNIFICANT GROWTH Performed at [facility], [address], [place], [place] [address]    Report Status 06/24/2019 FINAL  Final  Surgical PCR screen     Status: Abnormal   Collection Time: 06/26/19  1:32 PM   Specimen: Nasal Mucosa; Nasal Swab  Result Value Ref Range Status   MRSA, PCR POSITIVE (A) NEGATIVE Final    Comment: RESULT CALLED TO, READ BACK BY AND VERIFIED WITH: BETHEL,E. RN @1814  ON 12.10.2020 BY COHEN,K    Staphylococcus aureus POSITIVE (A) NEGATIVE Final    Comment: (NOTE) The Xpert SA Assay (FDA approved for NASAL specimens in patients 48 years of age and older), is one component of a comprehensive surveillance program. It is not intended to diagnose infection nor to guide or monitor treatment. Performed at [facility], [address] [place], [place]          Radiology Studies: No results found.      Scheduled Meds: . amLODipine  5 mg Oral Daily  . [START ON 06/28/2019] Chlorhexidine Gluconate Cloth  6 each Topical Q0600  . enoxaparin (LOVENOX) injection  60 mg Subcutaneous Q24H  . ferrous sulfate  325 mg Oral BID WC  . insulin aspart  0-15 Units Subcutaneous TID WC  . insulin aspart  0-5 Units Subcutaneous QHS  . insulin detemir  20 Units Subcutaneous Daily  . insulin detemir  35 Units Subcutaneous QHS  . mupirocin ointment  1 application Nasal BID  . senna-docusate  1 tablet Oral BID  . vitamin C  1,000 mg Oral Daily   Continuous Infusions: . clindamycin (CLEOCIN) IV 600 mg (06/27/19 1232)  . lactated ringers 100 mL/hr at 06/26/19  0300     LOS: 6 days     14/10/20, MD [facility]  06/27/2019, 6:04 PM

## 2019-06-27 NOTE — Progress Notes (Signed)
Physical Therapy Treatment Patient Details Name: Amber Jensen MRN: [id number] DOB: 1980/12/22 Today's Date: 06/27/2019    History of Present Illness 38 yo female admitted with abscess. S/P I&D R thigh 12/5. Hx of obesity, DM, HTN    PT Comments    Pt pleasant and motivated.  Assisted OOB to amb several laps in hallway.  General Gait Details: "this feels good" pt tolerated a great distance with minimal discomfort.    Follow Up Recommendations  No PT follow up;Supervision for mobility/OOB     Equipment Recommendations  Cane    Recommendations for Other Services       Precautions / Restrictions Precautions Precautions: Fall    Mobility  Bed Mobility Overal bed mobility: Modified Independent             General bed mobility comments: Increased time. Pt relied on bedrail. HOB elevated.  Transfers Overall transfer level: Needs assistance Equipment used: None Transfers: Sit to/from Stand Sit to Stand: Supervision         General transfer comment: Increased time. Pt used IV pole to pull up on.  Ambulation/Gait Ambulation/Gait assistance: Supervision Gait Distance (Feet): 700 Feet Assistive device: IV Pole Gait Pattern/deviations: Wide base of support;Step-through pattern Gait velocity: WFL   General Gait Details: "this feels good" pt tolerated a great distance with minimal discomfort   Stairs             Wheelchair Mobility    Modified Rankin (Stroke Patients Only)       Balance                                            Cognition Arousal/Alertness: Awake/alert Behavior During Therapy: WFL for tasks assessed/performed Overall Cognitive Status: Within Functional Limits for tasks assessed                                        Exercises      General Comments        Pertinent Vitals/Pain Pain Assessment: Faces Faces Pain Scale: Hurts a little bit Pain Location: R LE Pain Descriptors / Indicators:  Sore;Discomfort Pain Intervention(s): Monitored during session;Repositioned    Home Living                      Prior Function            PT Goals (current goals can now be found in the care plan section) Progress towards PT goals: Progressing toward goals    Frequency    Min 3X/week      PT Plan Current plan remains appropriate    Co-evaluation              AM-PAC PT "6 Clicks" Mobility   Outcome Measure  Help needed turning from your back to your side while in a flat bed without using bedrails?: None Help needed moving from lying on your back to sitting on the side of a flat bed without using bedrails?: None Help needed moving to and from a bed to a chair (including a wheelchair)?: A Little Help needed standing up from a chair using your arms (e.g., wheelchair or bedside chair)?: A Little Help needed to walk in hospital room?: A Little Help needed climbing 3-5 steps with a railing? :  A Little 6 Click Score: 20    End of Session   Activity Tolerance: Patient tolerated treatment well Patient left: in bed;with call bell/phone within reach   PT Visit Diagnosis: Pain;Difficulty in walking, not elsewhere classified (R26.2) Pain - Right/Left: Right Pain - part of body: Leg     Time: 1425-1450 PT Time Calculation (min) (ACUTE ONLY): 25 min  Charges:  $Gait Training: 8-22 mins $Therapeutic Activity: 8-22 mins                     Rica Koyanagi  PTA Acute  Rehabilitation Services Pager      [phone] Office      [phone]

## 2019-06-27 NOTE — Discharge Instructions (Signed)
Moisten gauze with normal saline and wring out well.  Do not leave packing gauze too wet or soupy.  Packed in the wound and cover with dry gauze/bandage and tape.  Remove packing prior to shower and replace when shower complete.

## 2019-06-27 NOTE — Progress Notes (Signed)
Patient ID: Amber Jensen, female   DOB: 05/11/1981, 38 y.o.   MRN: [id number]    1 Day Post-Op  Subjective: Patient doing well today. Some pain as expected still of her right thigh.  States the smell is better today.   ROS: See above, otherwise other systems negative  Objective: Vital signs in last 24 hours: Temp:  [97.8 F (36.6 C)-98.4 F (36.9 C)] 98.3 F (36.8 C) (12/11 0553) Pulse Rate:  [88-103] 88 (12/11 0553) Resp:  [15-22] 16 (12/11 0553) BP: (151-173)/(87-95) 155/92 (12/11 0553) SpO2:  [94 %-100 %] 96 % (12/11 0553) Last BM Date: 06/23/19  Intake/Output from previous day: 12/10 0701 - 12/11 0700 In: 600 [I.V.:400; IV Piggyback:200] Out: 634 [Urine:620; Blood:14] Intake/Output this shift: No intake/output data recorded.  PE: Skin: right medial thigh wound looks great today!  100% beefy red granulation tissue.  No evidence of purulent drainage or slough present.  Lab Results:  Recent Labs    06/26/19 0601 06/27/19 0603  WBC 15.3* 12.8*  HGB 10.6* 10.3*  HCT 34.2* 31.1*  PLT 386 379   BMET Recent Labs    06/26/19 0601 06/27/19 0603  NA  --  138  K  --  4.7  CL  --  102  CO2  --  22  GLUCOSE  --  319*  BUN  --  15  CREATININE 1.67* 1.72*  CALCIUM  --  8.3*   PT/INR No results for input(s): LABPROT, INR in the last 72 hours. CMP     Component Value Date/Time   NA 138 06/27/2019 0603   K 4.7 06/27/2019 0603   CL 102 06/27/2019 0603   CO2 22 06/27/2019 0603   GLUCOSE 319 (H) 06/27/2019 0603   BUN 15 06/27/2019 0603   CREATININE 1.72 (H) 06/27/2019 0603   CALCIUM 8.3 (L) 06/27/2019 0603   PROT 6.2 (L) 06/24/2019 0542   ALBUMIN 2.1 (L) 06/24/2019 0542   AST 10 (L) 06/24/2019 0542   ALT 9 06/24/2019 0542   ALKPHOS 82 06/24/2019 0542   BILITOT 0.4 06/24/2019 0542   GFRNONAA 37 (L) 06/27/2019 0603   GFRAA 42 (L) 06/27/2019 0603   Lipase  No results found for: LIPASE     Studies/Results: No results  found.  Anti-infectives: Anti-infectives (From admission, onward)   Start     Dose/Rate Route Frequency Ordered Stop   06/27/19 1000  vancomycin (VANCOCIN) 1,250 mg in sodium chloride 0.9 % 250 mL IVPB  Status:  Discontinued     1,250 mg 166.7 mL/hr over 90 Minutes Intravenous Daily 06/26/19 1019 06/26/19 1043   06/26/19 1100  clindamycin (CLEOCIN) IVPB 600 mg     600 mg 100 mL/hr over 30 Minutes Intravenous Every 8 hours 06/26/19 1043     06/24/19 1700  vancomycin (VANCOCIN) IVPB 1000 mg/200 mL premix  Status:  Discontinued     1,000 mg 200 mL/hr over 60 Minutes Intravenous Daily 06/24/19 1636 06/26/19 1019   06/23/19 1400  vancomycin (VANCOCIN) IVPB 1000 mg/200 mL premix     1,000 mg 200 mL/hr over 60 Minutes Intravenous  Once 06/23/19 1304 06/23/19 1524   06/22/19 1047  vancomycin variable dose per unstable renal function (pharmacist dosing)  Status:  Discontinued      Does not apply See admin instructions 06/22/19 1047 06/24/19 1636   06/21/19 2200  vancomycin (VANCOCIN) IVPB 1000 mg/200 mL premix  Status:  Discontinued     1,000 mg 200 mL/hr over 60 Minutes Intravenous Every 12 hours  06/21/19 1142 06/22/19 0827   06/21/19 1000  vancomycin (VANCOCIN) 2,500 mg in sodium chloride 0.9 % 500 mL IVPB     2,500 mg 250 mL/hr over 120 Minutes Intravenous  Once 06/21/19 0929 06/21/19 1245   06/21/19 0430  piperacillin-tazobactam (ZOSYN) IVPB 3.375 g     3.375 g 100 mL/hr over 30 Minutes Intravenous  Once 06/21/19 0427 06/21/19 0532       Assessment/Plan DM- per medicine.  HTN - per medicine  POD6, 1, s/p I&D of right thigh abscess, Dr. Ninfa Linden, Dr. Marlou Starks -wound looks significantly better today and further debridement. -WBC almost normalized -patient is surgically stable for DC home when felt medically stable.  I have written a Rx for her tramadol that is in her chart as her pharmacy is the [place] and these can not be sent there electronically apparently. -she will follow up with  our office in 2-3 weeks for a wound check -TOC is apparently working to get the patient some [place] for her and her daughter. -she will likely need only 5 more days of abx therapy at discharge. -CX MRSA and MODERATE PREVOTELLA BIVIA   FEN -carb mod diet VTE -lovenox ID -cleocin, can change to clinda oral at discharge   LOS: 6 days    Henreitta Cea , [facility] 06/27/2019, 10:56 AM Please see Amion for pager number during day hours 7:00am-4:30pm

## 2019-06-27 NOTE — Progress Notes (Signed)
Inpatient Diabetes Program Recommendations  AACE/ADA: New Consensus Statement on Inpatient Glycemic Control (2015)  Target Ranges:  Prepandial:   less than 140 mg/dL      Peak postprandial:   less than 180 mg/dL (1-2 hours)      Critically ill patients:  140 - 180 mg/dL   Lab Results  Component Value Date   GLUCAP 166 (H) 06/27/2019   HGBA1C 11.9 (H) 06/22/2019    Review of Glycemic Control  Diabetes history: DM2 Outpatient Diabetes medications: Levemir 25 in am and 45 QHS, metformin 500 mg Q breakfast Current orders for Inpatient glycemic control: Levemir 20 in am and 35 units QHS, Novolog 0-15 units tidwc and 0-5 units QHS  HgbA1C - 11.9% - uncontrolled  Inpatient Diabetes Program Recommendations:     May need meal coverage insulin if post-prandials > 180 mg/dL. Looks good right now.  Spoke with pt regarding her HgbA1C of 11.9%. Pt states it has come down from > 12%. Had not been taking her insulin as prescribed and missed doses occasionally. Discussed importance of controlling blood sugars to reduce risk of long-term complications. Discussed how diet, exercise and stress management all affect blood sugar control. Discussed goal of HgbA1C is 7%. Continue with lifestyle modifications of weight-loss and exercise to help her body use her own insulin. Instructed to monitor 3-4x/day and take logbook to PCP for review.   Follow.  Thank you. Lorenda Peck, RD, LDN, CDE Inpatient Diabetes Coordinator [phone]

## 2019-06-28 LAB — CBC
HCT: 33.2 % — ABNORMAL LOW (ref 36.0–46.0)
Hemoglobin: 10.2 g/dL — ABNORMAL LOW (ref 12.0–15.0)
MCH: 25.1 pg — ABNORMAL LOW (ref 26.0–34.0)
MCHC: 30.7 g/dL (ref 30.0–36.0)
MCV: 81.8 fL (ref 80.0–100.0)
Platelets: 475 10*3/uL — ABNORMAL HIGH (ref 150–400)
RBC: 4.06 MIL/uL (ref 3.87–5.11)
RDW: 13.2 % (ref 11.5–15.5)
WBC: 14.3 10*3/uL — ABNORMAL HIGH (ref 4.0–10.5)
nRBC: 0 % (ref 0.0–0.2)

## 2019-06-28 LAB — BASIC METABOLIC PANEL
Anion gap: 12 (ref 5–15)
BUN: 12 mg/dL (ref 6–20)
CO2: 25 mmol/L (ref 22–32)
Calcium: 8.3 mg/dL — ABNORMAL LOW (ref 8.9–10.3)
Chloride: 103 mmol/L (ref 98–111)
Creatinine, Ser: 1.49 mg/dL — ABNORMAL HIGH (ref 0.44–1.00)
GFR calc Af Amer: 50 mL/min — ABNORMAL LOW (ref >60)
GFR calc non Af Amer: 43 mL/min — ABNORMAL LOW (ref >60)
Glucose, Bld: 61 mg/dL — ABNORMAL LOW (ref 70–99)
Potassium: 3 mmol/L — ABNORMAL LOW (ref 3.5–5.1)
Sodium: 140 mmol/L (ref 135–145)

## 2019-06-28 LAB — GLUCOSE, CAPILLARY
Glucose-Capillary: 61 mg/dL — ABNORMAL LOW (ref 70–99)
Glucose-Capillary: 87 mg/dL (ref 70–99)
Glucose-Capillary: 98 mg/dL (ref 70–99)
Glucose-Capillary: 153 mg/dL — ABNORMAL HIGH (ref 70–99)
Glucose-Capillary: 113 mg/dL — ABNORMAL HIGH (ref 70–99)
Glucose-Capillary: 296 mg/dL — ABNORMAL HIGH (ref 70–99)

## 2019-06-28 NOTE — Progress Notes (Signed)
PROGRESS NOTE    Amber Jensen  TIW:[id number] DOB: Feb 08, 1981 DOA: 06/21/2019 PCP: System, Pcp Not In    Brief Narrative:   38 year old lady with prior h/o uncontrolled DM, hypertension, admitted for right upper medial thigh swelling, was found to have thigh abscess underwent I&D by general surgery and is currently on broad spectrum IV antibiotics.  She was also found to have an acute kidney injury. Tissue cultures growing MRSA.    Assessment & Plan:   Principal Problem:   Sepsis (HCC) Active Problems:   Abscess   DM (diabetes mellitus), secondary, uncontrolled, with renal complications (HCC)   Essential hypertension   Acute renal failure (ARF) (HCC)   Sepsis.  Poa. Patient presented with persistent tachycardia and leukocytosis. Source is right thigh abscess. UA negative for UTI (had contaminant but patient asymptomatic). CXR negative for pna. Blood cultures with NGTD but surgical wound culture with MRSA (resistant to tetracycline) and Prevotella bivia. Continue IV fluids.  Changed antibiotics from IV Vanc to IV Clindamycin to provide coverage for both MRSA and Prevotella bivia . May transition to po clindamycin at the time of discharge.   -Leukocytosis slightly worsened overnight but wound is healing well with no drainage, afebrile. Will continue Clindamycin IV, repeat labs in am.    Right thigh abscess S/p incision and drainage by general surgery  Dr Magnus Ivan on 06/21/19 and again on 12/10 by Dr. Carolynne Edouard.  Blood cultures have been negative so far but tissue cultures positive for MRSA resistant to doxycycline, oxacillin and erythromycin but sensitive to Vancomycin and Clindamycin.  Tissue culture now also growing Prevotella bivia. Antibiotic changed on 12/10 from IV vancomycin to IV clindamycin. Continue pain medication. Wound care as recommended per Surgery.   Diabetes Mellitus:  cbg's trended up today but likely due to post-operative state.  -Continue decreased dose of levemir to  20 units daily and 35 units at bedtime. Adjust insulin as needed. Will need better glycemic control at home to promote wound healing.   CBG (last 3)  Recent Labs    06/28/19 0701 06/28/19 0816 06/28/19 1228  GLUCAP 87 98 153*   A1c is 11.9 Continue with SSI.  -Appreciate diabetes educator's recommendations.   Essential hypertension BP elevated but trending down. Will continue newly started amlodipine but increase dose to 10mg  po daily. She is on HCTZ at home but this is currently held due to her AKI. She reports being on amlodipine years ago and had good blood pressure control not sure why this was discontinued.     AKI Fe Na is 0.3, probably pre renal causes. No hypotension noted. She denies having any contrast studies recently. Renal ultrasound is negative for hydronephrosis. UA is negative for infection (culture had mixed flora). She reports taking Ibuprofen daily prior to her presentation, question possible ATN as well.  Continue with IV fluids at NS 100 ml/hr.  Her Scr is improving but still not at her baseline. UOP has been good.   Repeat Labs in am.   -Avoid NSAIDs and nephrotoxic medications.    Anemia of chronic disease/ normocytic anemia.  Anemia panel shows low iron levels. Iron supplementation added.  Hemoglobins stable around 10-11.    Leukocytosis Due to sepsis per above.   Improved with IV antibiotics but slightly worsened overnight.    Hyponatremia Improved with hydration.   Hypokalemia:  Replace as needed.    DVT prophylaxis: Lovenox Code Status: FULL CODE.  Family Communication: None at bedside.  Disposition Plan: pending improvement of  renal function.    Consultants:   General surgery  Procedures: S/p incision and drainage by general surgery on 06/21/2019 and on 12/10 Antimicrobials: IV vancomycin>>1210 IV Clindamycin 12/10>> Subjective: Patient seen and examined earlier this afternoon. She reports feeling better. Denies dysuria but reports  having right CVA tenderness a few days prior to her presentation. Reports that her right thigh wound is only mildly painful now.   Objective: Vitals:   06/27/19 1322 06/27/19 2015 06/28/19 0559 06/28/19 1232  BP: (!) 153/83 (!) 151/81 (!) 184/97 (!) 160/90  Pulse: 89 88 86 74  Resp: Temp: 97.8 F (36.6 C) 97.6 F (36.4 C) 98.2 F (36.8 C) 97.6 F (36.4 C)  TempSrc: Oral Oral Oral Oral  SpO2: 97% 100% 100% 98%  Weight:      Height:        Intake/Output Summary (Last 24 hours) at 06/28/2019 1549 Last data filed at 06/28/2019 1330 Gross per 24 hour  Intake 1491.07 ml  Output --  Net 1491.07 ml   Filed Weights   06/21/19 0925 06/23/19 1009  Weight: 133.4 kg 133 kg    Examination:  General exam: NAD, resting in bed.   Respiratory system: No respiratory distress. No wheezing.  Cardiovascular system: RRR. S1 and S2 present.  Gastrointestinal system: abd is soft, non tender, non distended.   Central nervous system: alert and oriented x3. No hemiparesis.  Extremities: right medial thigh wound with tenderness to palpation but no induration, surgical wound is covered with bandage that is c/d/i.  Skin: no rashes seen.  Psychiatry:. Mood is appropriate.     Data Reviewed: I have personally reviewed following labs and imaging studies  CBC: Recent Labs  Lab 06/23/19 0414 06/24/19 0542 06/26/19 0601 06/27/19 0603 06/28/19 0550  WBC 21.9* 16.6* 15.3* 12.8* 14.3*  HGB 11.5* 10.6* 10.6* 10.3* 10.2*  HCT 37.2 33.8* 34.2* 31.1* 33.2*  MCV 82.3 80.7 83.2 77.9* 81.8  PLT 359 323 386 379 475*   Basic Metabolic Panel: Recent Labs  Lab 06/22/19 1750 06/23/19 0414 06/24/19 0542 06/25/19 0543 06/26/19 0601 06/27/19 0603 06/28/19 0550  NA 130* 134* 137  --   --  138 140  K 4.2 3.6 3.4*  --   --  4.7 3.0*  CL 101 105 108  --   --  102 103  CO2 18* 16* 19*  --   --  22 25  GLUCOSE 519* 173* 124*  --   --  319* 61*  BUN 38* 40* 33*  --   --  15 12  CREATININE  2.46* 2.19* 2.09* 1.93* 1.67* 1.72* 1.49*  CALCIUM 8.1* 8.4* 8.1*  --   --  8.3* 8.3*   GFR: Estimated Creatinine Clearance: 70.4 mL/min (A) (by C-G formula based on SCr of 1.49 mg/dL (H)). Liver Function Tests: Recent Labs  Lab 06/22/19 0300 06/24/19 0542  AST 15 10*  ALT 9 9  ALKPHOS 95 82  BILITOT 0.9 0.4  PROT 6.8 6.2*  ALBUMIN 2.6* 2.1*   No results for input(s): LIPASE, AMYLASE in the last 168 hours. No results for input(s): AMMONIA in the last 168 hours. Coagulation Profile: No results for input(s): INR, PROTIME in the last 168 hours. Cardiac Enzymes: No results for input(s): CKTOTAL, CKMB, CKMBINDEX, TROPONINI in the last 168 hours. BNP (last 3 results) No results for input(s): PROBNP in the last 8760 hours. HbA1C: No results for input(s): HGBA1C in the last 72 hours. CBG: Recent Labs  Lab 06/27/19 2105 06/28/19 0555 06/28/19 0701 06/28/19 0816 06/28/19 1228  GLUCAP 89 61* 87 98 153*   Lipid Profile: No results for input(s): CHOL, HDL, LDLCALC, TRIG, CHOLHDL, LDLDIRECT in the last 72 hours. Thyroid Function Tests: No results for input(s): TSH, T4TOTAL, FREET4, T3FREE, THYROIDAB in the last 72 hours. Anemia Panel: No results for input(s): VITAMINB12, FOLATE, FERRITIN, TIBC, IRON, RETICCTPCT in the last 72 hours. Sepsis Labs: No results for input(s): PROCALCITON, LATICACIDVEN in the last 168 hours.  Recent Results (from the past 240 hour(s))  SARS CORONAVIRUS 2 (TAT 6-24 HRS) Nasopharyngeal Nasopharyngeal Swab     Status: None   Collection Time: 06/21/19  1:50 AM   Specimen: Nasopharyngeal Swab  Result Value Ref Range Status   SARS Coronavirus 2 NEGATIVE NEGATIVE Final    Comment: (NOTE) SARS-CoV-2 target nucleic acids are NOT DETECTED. The SARS-CoV-2 RNA is generally detectable in upper and lower respiratory specimens during the acute phase of infection. Negative results do not preclude SARS-CoV-2 infection, do not rule out co-infections with other  pathogens, and should not be used as the sole basis for treatment or other patient management decisions. Negative results must be combined with clinical observations, patient history, and epidemiological information. The expected result is Negative. Fact Sheet for Patients: HairSlick.nohttps://www.fda.gov/media/138098/download Fact Sheet for Healthcare Providers: quierodirigir.comhttps://www.fda.gov/media/138095/download This test is not yet approved or cleared by the [place] FDA and  has been authorized for detection and/or diagnosis of SARS-CoV-2 by FDA under an Emergency Use Authorization (EUA). This EUA will remain  in effect (meaning this test can be used) for the duration of the COVID-19 declaration under Section 56 4(b)(1) of the Act, 21 U.S.C. section 360bbb-3(b)(1), unless the authorization is terminated or revoked sooner. Performed at [facility], [address] [place], [place] [address]   Urine culture     Status: Abnormal   Collection Time: 06/21/19  4:21 AM   Specimen: Urine, Clean Catch  Result Value Ref Range Status   Specimen Description   Final    URINE, CLEAN CATCH Performed at [facility], [address] [place], [place] [address]    Special Requests   Final    NONE Performed at [facility], [address] [place], [place] [address]    Culture MULTIPLE SPECIES PRESENT, SUGGEST RECOLLECTION (A)  Final   Report Status 06/22/2019 FINAL  Final  Culture, blood (routine x 2)     Status: None   Collection Time: 06/21/19  4:55 AM   Specimen: BLOOD  Result Value Ref Range Status   Specimen Description   Final    BLOOD RIGHT ANTECUBITAL Performed at [facility], [address] [place], [place] [address]    Special Requests   Final    BOTTLES DRAWN AEROBIC AND ANAEROBIC Blood Culture results may not be optimal due to an excessive volume of blood received in culture bottles Performed at [facility],  [address] [place], [place] [address]    Culture   Final    NO GROWTH 5 DAYS Performed at [facility], [address] [place], [place] [address]    Report Status 06/26/2019 FINAL  Final  Culture, blood (routine x 2)     Status: None   Collection Time: 06/21/19  4:55 AM   Specimen: BLOOD LEFT HAND  Result Value Ref Range Status   Specimen Description   Final    BLOOD LEFT HAND Performed at [facility], [place] [address], [place], [place] [address]    Special Requests   Final    BOTTLES DRAWN AEROBIC AND ANAEROBIC Blood Culture adequate volume Performed at [place] [address] [place], [place] [address]    Culture   Final    NO GROWTH 5 DAYS Performed at [facility], [place] [address] [place], [place] [address]    Report Status 06/26/2019 FINAL  Final  SARS Coronavirus 2 by RT PCR (hospital order, performed in [facility] hospital lab) Nasopharyngeal Nasopharyngeal Swab     Status: None   Collection Time: 06/21/19  7:00 AM   Specimen: Nasopharyngeal Swab  Result Value Ref Range Status   SARS Coronavirus 2 NEGATIVE NEGATIVE Final    Comment: (NOTE) SARS-CoV-2 target nucleic acids are NOT DETECTED. The SARS-CoV-2 RNA is generally detectable in upper and lower respiratory specimens during the acute phase of infection. The lowest concentration of SARS-CoV-2 viral copies this assay can detect is 250 copies / mL. A negative result does not preclude SARS-CoV-2 infection and should not be used as the sole basis for treatment or other patient management decisions.  A negative result may occur with improper specimen collection / handling, submission of specimen other than nasopharyngeal swab, presence of viral mutation(s) within the areas targeted by this assay, and inadequate number of viral copies (<250 copies / mL). A negative result must be combined with clinical observations, patient history, and epidemiological  information. Fact Sheet for Patients:   StrictlyIdeas.no Fact Sheet for Healthcare Providers: BankingDealers.co.za This test is not yet approved or cleared  by the [place] FDA and has been authorized for detection and/or diagnosis of SARS-CoV-2 by FDA under an Emergency Use Authorization (EUA).  This EUA will remain in effect (meaning this test can be used) for the duration of the COVID-19 declaration under Section 564(b)(1) of the Act, 21 U.S.C. section 360bbb-3(b)(1), unless the authorization is terminated or revoked sooner. Performed at [facility], [place] [address] [place], [place] [address]   Aerobic/Anaerobic Culture (surgical/deep wound)     Status: None   Collection Time: 06/21/19  1:42 PM   Specimen: Abscess  Result Value Ref Range Status   Specimen Description   Final    ABSCESS Performed at [place] [address], [place], [place] [address]    Special Requests   Final    NONE Performed at [facility], [place] [address] [place], [place] [address]    Gram Stain   Final    RARE WBC PRESENT, PREDOMINANTLY PMN MODERATE GRAM POSITIVE COCCI MODERATE GRAM NEGATIVE RODS FEW GRAM POSITIVE RODS    Culture   Final    MODERATE METHICILLIN RESISTANT STAPHYLOCOCCUS AUREUS MODERATE PREVOTELLA BIVIA BETA LACTAMASE POSITIVE Performed at [facility], [place] [address] [place], [place] [address]    Report Status 06/25/2019 FINAL  Final   Organism ID, Bacteria METHICILLIN RESISTANT STAPHYLOCOCCUS AUREUS  Final      Susceptibility   Methicillin resistant staphylococcus aureus - MIC*    CIPROFLOXACIN <=0.5 SENSITIVE Sensitive     ERYTHROMYCIN >=8 RESISTANT Resistant     GENTAMICIN <=0.5 SENSITIVE Sensitive     OXACILLIN RESISTANT Resistant     TETRACYCLINE >=16 RESISTANT Resistant     VANCOMYCIN <=0.5 SENSITIVE Sensitive     TRIMETH/SULFA <=10 SENSITIVE Sensitive      CLINDAMYCIN <=0.25 SENSITIVE Sensitive     RIFAMPIN <=0.5 SENSITIVE Sensitive  Inducible Clindamycin NEGATIVE Sensitive     * MODERATE METHICILLIN RESISTANT STAPHYLOCOCCUS AUREUS  Culture, Urine     Status: Abnormal   Collection Time: 06/22/19 10:40 AM   Specimen: Urine, Clean Catch  Result Value Ref Range Status   Specimen Description   Final    URINE, CLEAN CATCH Performed at [facility], [address] [place], [place] [address]    Special Requests   Final    NONE Performed at [facility], [address], [place], [place] [address]    Culture (A)  Final    <10,000 COLONIES/mL INSIGNIFICANT GROWTH Performed at [facility], [address] [place], [place] [address]    Report Status 06/24/2019 FINAL  Final  Surgical PCR screen     Status: Abnormal   Collection Time: 06/26/19  1:32 PM   Specimen: Nasal Mucosa; Nasal Swab  Result Value Ref Range Status   MRSA, PCR POSITIVE (A) NEGATIVE Final    Comment: RESULT CALLED TO, READ BACK BY AND VERIFIED WITH: BETHEL,E. RN  ON 12.10.2020 BY COHEN,K    Staphylococcus aureus POSITIVE (A) NEGATIVE Final    Comment: (NOTE) The Xpert SA Assay (FDA approved for NASAL specimens in patients 42 years of age and older), is one component of a comprehensive surveillance program. It is not intended to diagnose infection nor to guide or monitor treatment. Performed at [facility], [address] [place], [place] [address]          Radiology Studies: No results found.      Scheduled Meds: . amLODipine  5 mg Oral Daily  . Chlorhexidine Gluconate Cloth  6 each Topical Q0600  . enoxaparin (LOVENOX) injection  60 mg Subcutaneous Q24H  . ferrous sulfate  325 mg Oral BID WC  . insulin aspart  0-15 Units Subcutaneous TID WC  . insulin aspart  0-5 Units Subcutaneous QHS  . insulin detemir  20 Units Subcutaneous Daily  . insulin detemir  35 Units Subcutaneous  QHS  . mupirocin ointment  1 application Nasal BID  . senna-docusate  1 tablet Oral BID  . vitamin C  1,000 mg Oral Daily   Continuous Infusions: . clindamycin (CLEOCIN) IV Stopped (06/28/19 1040)  . lactated ringers 100 mL/hr at 06/28/19 1222     LOS: 7 days     Ky Barban, MD [facility]  06/28/2019, 3:49 PM

## 2019-06-28 NOTE — Progress Notes (Signed)
Progress Note: General Surgery Service   Chief Complaint/Subjective: Tolerating diet, ambulating well, only painful when wound inspected  Objective: Vital signs in last 24 hours: Temp:  [97.6 F (36.4 C)-98.2 F (36.8 C)] 98.2 F (36.8 C) (12/12 0559) Pulse Rate:  [86-98] 86 (12/12 0559) Resp:  [18] 18 (12/12 0559) BP: (145-184)/(81-97) 184/97 (12/12 0559) SpO2:  [97 %-100 %] 100 % (12/12 0559) Last BM Date: 06/27/19  Intake/Output from previous day: No intake/output data recorded. Intake/Output this shift: No intake/output data recorded.  Gen: NAD  Resp: nonlabored  Card: RRR  Ext: right inner thigh wound open with clean base, no purulent drainage  Lab Results: CBC  Recent Labs    06/27/19 0603 06/28/19 0550  WBC 12.8* 14.3*  HGB 10.3* 10.2*  HCT 31.1* 33.2*  PLT 379 475*   BMET Recent Labs    06/27/19 0603 06/28/19 0550  NA 138 140  K 4.7 3.0*  CL 102 103  CO2 22 25  GLUCOSE 319* 61*  BUN 15 12  CREATININE 1.72* 1.49*  CALCIUM 8.3* 8.3*   PT/INR No results for input(s): LABPROT, INR in the last 72 hours. ABG No results for input(s): PHART, HCO3 in the last 72 hours.  Invalid input(s): PCO2, PO2  Anti-infectives: Anti-infectives (From admission, onward)   Start     Dose/Rate Route Frequency Ordered Stop   06/27/19 1000  vancomycin (VANCOCIN) 1,250 mg in sodium chloride 0.9 % 250 mL IVPB  Status:  Discontinued     1,250 mg 166.7 mL/hr over 90 Minutes Intravenous Daily 06/26/19 1019 06/26/19 1043   06/26/19 1100  clindamycin (CLEOCIN) IVPB 600 mg     600 mg 100 mL/hr over 30 Minutes Intravenous Every 8 hours 06/26/19 1043     06/24/19 1700  vancomycin (VANCOCIN) IVPB 1000 mg/200 mL premix  Status:  Discontinued     1,000 mg 200 mL/hr over 60 Minutes Intravenous Daily 06/24/19 1636 06/26/19 1019   06/23/19 1400  vancomycin (VANCOCIN) IVPB 1000 mg/200 mL premix     1,000 mg 200 mL/hr over 60 Minutes Intravenous  Once 06/23/19 1304 06/23/19 1524    06/22/19 1047  vancomycin variable dose per unstable renal function (pharmacist dosing)  Status:  Discontinued      Does not apply See admin instructions 06/22/19 1047 06/24/19 1636   06/21/19 2200  vancomycin (VANCOCIN) IVPB 1000 mg/200 mL premix  Status:  Discontinued     1,000 mg 200 mL/hr over 60 Minutes Intravenous Every 12 hours 06/21/19 1142 06/22/19 0827   06/21/19 1000  vancomycin (VANCOCIN) 2,500 mg in sodium chloride 0.9 % 500 mL IVPB     2,500 mg 250 mL/hr over 120 Minutes Intravenous  Once 06/21/19 0929 06/21/19 1245   06/21/19 0430  piperacillin-tazobactam (ZOSYN) IVPB 3.375 g     3.375 g 100 mL/hr over 30 Minutes Intravenous  Once 06/21/19 0427 06/21/19 0532      Medications: Scheduled Meds: . amLODipine  5 mg Oral Daily  . Chlorhexidine Gluconate Cloth  6 each Topical Q0600  . enoxaparin (LOVENOX) injection  60 mg Subcutaneous Q24H  . ferrous sulfate  325 mg Oral BID WC  . insulin aspart  0-15 Units Subcutaneous TID WC  . insulin aspart  0-5 Units Subcutaneous QHS  . insulin detemir  20 Units Subcutaneous Daily  . insulin detemir  35 Units Subcutaneous QHS  . mupirocin ointment  1 application Nasal BID  . senna-docusate  1 tablet Oral BID  . vitamin C  1,000  mg Oral Daily   Continuous Infusions: . clindamycin (CLEOCIN) IV 600 mg (06/28/19 0318)  . lactated ringers 100 mL/hr at 06/28/19 0313   PRN Meds:.acetaminophen, fentaNYL (SUBLIMAZE) injection, gabapentin, hydrALAZINE, traMADol  Assessment/Plan: s/p Procedure(s): IRRIGATION AND DEBRIDEMENT right thigh  ABSCESS 06/26/2019  DM- per medicine.  HTN - per medicine  POD7, 2 s/p I&D of right thigh abscess, Dr. Magnus Ivan, Dr. Carolynne Edouard -wound looks clean and healing -WBC up slightly from yesterday -she will follow up with our office in 2-3 weeks for a wound check -TOC is apparently working to get the patient some HH for her and her daughter. -she will likely need only 5 more days of abx therapy at  discharge. -CX MRSA and MODERATE PREVOTELLA BIVIA   FEN -carb mod diet VTE -lovenox ID -cleocin, can change to clinda oral at discharge   LOS: 7 days   Rodman Pickle, MD [phone] [facility]

## 2019-06-29 LAB — GLUCOSE, CAPILLARY
Glucose-Capillary: 66 mg/dL — ABNORMAL LOW (ref 70–99)
Glucose-Capillary: 133 mg/dL — ABNORMAL HIGH (ref 70–99)
Glucose-Capillary: 149 mg/dL — ABNORMAL HIGH (ref 70–99)
Glucose-Capillary: 189 mg/dL — ABNORMAL HIGH (ref 70–99)

## 2019-06-29 LAB — BASIC METABOLIC PANEL
Anion gap: 11 (ref 5–15)
BUN: 10 mg/dL (ref 6–20)
CO2: 28 mmol/L (ref 22–32)
Calcium: 8.4 mg/dL — ABNORMAL LOW (ref 8.9–10.3)
Chloride: 99 mmol/L (ref 98–111)
Creatinine, Ser: 1.54 mg/dL — ABNORMAL HIGH (ref 0.44–1.00)
GFR calc Af Amer: 48 mL/min — ABNORMAL LOW (ref >60)
GFR calc non Af Amer: 42 mL/min — ABNORMAL LOW (ref >60)
Glucose, Bld: 146 mg/dL — ABNORMAL HIGH (ref 70–99)
Potassium: 3.2 mmol/L — ABNORMAL LOW (ref 3.5–5.1)
Sodium: 138 mmol/L (ref 135–145)

## 2019-06-29 LAB — CBC
HCT: 32.8 % — ABNORMAL LOW (ref 36.0–46.0)
Hemoglobin: 10.1 g/dL — ABNORMAL LOW (ref 12.0–15.0)
MCH: 25.3 pg — ABNORMAL LOW (ref 26.0–34.0)
MCHC: 30.8 g/dL (ref 30.0–36.0)
MCV: 82 fL (ref 80.0–100.0)
Platelets: 367 10*3/uL (ref 150–400)
RBC: 4 MIL/uL (ref 3.87–5.11)
RDW: 13.1 % (ref 11.5–15.5)
WBC: 10.2 10*3/uL (ref 4.0–10.5)
nRBC: 0 % (ref 0.0–0.2)

## 2019-06-29 MED ORDER — AMLODIPINE BESYLATE 10 MG PO TABS
10.0000 mg | ORAL_TABLET | Freq: Every day | ORAL | Status: DC
  Administered 2019-06-29: 10 mg via ORAL
  Filled 2019-06-29: qty 1

## 2019-06-29 MED ORDER — PROBIOTIC DAILY PO CAPS: ORAL_CAPSULE | ORAL | 0 refills | Status: AC

## 2019-06-29 MED ORDER — AMLODIPINE BESYLATE 10 MG PO TABS: 10.0000 mg | ORAL_TABLET | Freq: Every day | ORAL | 0 refills | Status: AC

## 2019-06-29 MED ORDER — CLINDAMYCIN HCL 300 MG PO CAPS: 300.0000 mg | ORAL_CAPSULE | Freq: Four times a day (QID) | ORAL | 0 refills | Status: AC

## 2019-06-29 MED ORDER — POTASSIUM CHLORIDE CRYS ER 20 MEQ PO TBCR
40.0000 meq | EXTENDED_RELEASE_TABLET | Freq: Once | ORAL | Status: AC
  Administered 2019-06-29: 40 meq via ORAL
  Filled 2019-06-29: qty 2

## 2019-06-29 MED ORDER — CHLORHEXIDINE GLUCONATE CLOTH 2 % EX PADS: 6.0000 | MEDICATED_PAD | Freq: Every day | CUTANEOUS | 0 refills | Status: AC

## 2019-06-29 MED ORDER — ONDANSETRON HCL 4 MG PO TABS: 4.0000 mg | ORAL_TABLET | Freq: Three times a day (TID) | ORAL | 0 refills | Status: AC | PRN

## 2019-06-29 NOTE — Progress Notes (Signed)
S: no complaints O: BP (!) 197/91 (BP Location: Right Arm)   Pulse 92   Temp 98.2 F (36.8 C) (Oral)   Resp 18   Ht 5\' 6"  (1.676 m)   Wt 133 kg   LMP 06/06/2019   SpO2 94%   BMI 47.33 kg/m  Gen: NAD Neuro: AOx4 Wound: dry bandage without surrounding erythema  A/PPOD 8, 3 I+D of right thigh -WBC normal -patient understands wound care needs -ok to discharge today from surgery standpoint -continue oral clinda at discharge

## 2019-06-29 NOTE — Progress Notes (Signed)
Reviewed discharge paperwork, went over medication regimen, & follow up appointments. Provided prescriptions and dressing changes. Patient discharged by NT via wheelchair.

## 2019-06-29 NOTE — Discharge Summary (Signed)
Physician Discharge Summary  Amber Jensen WUJ:[id number] DOB: [birth year] DOA: 06/21/2019  PCP: System, Pcp Not In  Admit date: 06/21/2019 Discharge date: 06/29/2019  Admitted From: Home   Disposition: Home  Recommendations for Outpatient Follow-up:  1. Follow up with PCP in 1-2 days 2. Please obtain BMP in 2-3 days for renal function monitoring   Home Health: No. Though patient will consider getting it through her PCP at the [facility] if she needs [facility]  RN for wound care. The patient will have help from her daughter for wound care and will follow up closely with Surgery.  Equipment/Devices:None Discharge Condition:Stable CODE STATUS: Full Diet recommendation: Carb Mod   Brief/Interim Summary: 38 year old lady with prior h/o uncontrolled DM, hypertension, admitted for right upper medial thigh swelling, was found to have thigh abscess underwent I&D by general surgery and is currently on broad spectrum IV antibiotics.  She was also found to have an acute kidney injury. Tissue cultures growing MRSA.    Discharge Diagnoses:  Principal Problem:   Sepsis (HCC) Active Problems:   Abscess   DM (diabetes mellitus), secondary, uncontrolled, with renal complications (HCC)   Essential hypertension   Acute renal failure (ARF) (HCC)  Sepsis.  Poa. Patient presented with persistent tachycardia and leukocytosis. Source is right thigh abscess. UA negative for UTI (had contaminant but patient asymptomatic). CXR negative for pna. Blood cultures with NGTD but surgical wound culture with MRSA (resistant to tetracycline) and Prevotella bivia. Continue IV fluids.  Changed antibiotics from IV Vanc to IV Clindamycin to provide coverage for both MRSA and Prevotella bivia . May transition to po clindamycin at the time of discharge.   -Leukocytosis improved prior to discharge. She was treated with IV Clindamycin and will be transitioned to po Clindamycin at the time of discharge.    Right thigh abscess S/p  incision and drainage by general surgery  Dr Magnus Ivan on 06/21/19 and again on 12/10 by Dr. Carolynne Edouard.  Blood cultures have been negative so far but tissue cultures positive for MRSA resistant to doxycycline, oxacillin and erythromycin but sensitive to Vancomycin and Clindamycin.  Tissue culture now also growing Prevotella bivia. Antibiotic changed on 12/10 from IV vancomycin to IV clindamycin. Continue pain medication. She will be transitioned to po clindamycin at the time of discharge. Wound care as recommended per Surgery. She will have her daughter to help with the wound dressing changes but [facility]--Case management shared the information with the patient.   Diabetes Mellitus:  She will resume her home regiment.   CBG (last 3)  Recent Labs (last 2 labs)        Recent Labs    06/28/19 0701 06/28/19 0816 06/28/19 1228  GLUCAP 87 98 153*     A1c is 11.9 Continue with SSI.  -Appreciate diabetes educator's recommendations.   Essential hypertension BP elevated but trending down. Will continue newly started amlodipine  po daily. She is on HCTZ and lisinopril at home but these are currently held due to her AKI. She reports being on amlodipine years ago and had good blood pressure control not sure why this was discontinued.  She will follow up with her PCP for BP monitoring.    AKI Fe Na is 0.3, probably pre renal causes. No hypotension noted. She denies having any contrast studies recently. Renal ultrasound is negative for hydronephrosis. UA is negative for infection (culture had mixed flora). She reports taking Ibuprofen daily prior  to her presentation, question possible ATN as well.  She was treated with IV fluids. Her Scr peaked at 2.46 and trended down to 1.5 at the time of discharge. Her baseline if <1. UOP has been good.   Repeat Labs at the time of follow up.   -Avoid NSAIDs and nephrotoxic medications.    Anemia  of chronic disease/ normocytic anemia.  Anemia panel shows low iron levels. Iron supplementation added.  Hemoglobins stable around 10-11.    Leukocytosis Due to sepsis per above.  Resolved prior to discharge.  Improved with IV antibiotics.    Hyponatremia Improved with hydration.   Hypokalemia:  Replaced.    Discharge Instructions  Discharge Instructions    Ambulatory referral to Nutrition and Diabetic Education   Complete by: As directed    Diet - low sodium heart healthy   Complete by: As directed    Increase activity slowly   Complete by: As directed      Allergies as of 06/29/2019      Reactions   Gabapentin Other (See Comments)   Hallucinations   Percocet [oxycodone-acetaminophen] Other (See Comments)   Almost passed out   Vicodin [hydrocodone-acetaminophen] Other (See Comments)   Almost passed out      Medication List    STOP taking these medications   hydrochlorothiazide 25 MG tablet Commonly known as: HYDRODIURIL   ibuprofen 200 MG tablet Commonly known as: ADVIL   lisinopril 40 MG tablet Commonly known as: ZESTRIL     TAKE these medications   acetaminophen 325 MG tablet Commonly known as: TYLENOL Take 2 tablets (650 mg total) by mouth every 6 (six) hours as needed for mild pain or fever.   amLODipine 10 MG tablet Commonly known as: NORVASC Take 1 tablet (10 mg total) by mouth daily. Start taking on: June 30, 2019   Biotin 1000 MCG tablet Take 1,000 mcg by mouth daily.   Chlorhexidine Gluconate Cloth 2 % Pads Apply 6 each topically daily at 6 (six) AM. Start taking on: June 30, 2019   clindamycin 300 MG capsule Commonly known as: CLEOCIN Take 1 capsule (300 mg total) by mouth 4 (four) times daily for 7 days.   gabapentin 100 MG capsule Commonly known as: NEURONTIN Take 100 mg by mouth 3 (three) times daily as needed (pain).   insulin detemir 100 UNIT/ML injection Commonly known as: LEVEMIR Inject 25-45 Units into the  skin See admin instructions. 25 units in the am and 45 units at bedtime   metFORMIN 500 MG 24 hr tablet Commonly known as: GLUCOPHAGE-XR Take 500 mg by mouth daily with breakfast.   multivitamin with minerals Tabs tablet Take 1 tablet by mouth daily.   ondansetron 4 MG tablet Commonly known as: Zofran Take 1 tablet (4 mg total) by mouth every 8 (eight) hours as needed for nausea or vomiting.   pioglitazone 45 MG tablet Commonly known as: ACTOS Take 45 mg by mouth daily.   Probiotic Daily Caps 1 capsule by mouth twice daily while taking antibiotics   traMADol 50 MG tablet Commonly known as: ULTRAM Take 1-2 tablets (50-100 mg total) by mouth every 6 (six) hours as needed for moderate pain or severe pain.   vitamin C 1000 MG tablet Take 1,000 mg by mouth 3 (three) times daily.      Follow-up Information    [facility] Follow up.   Why: Please follow up for nursing needs  Contact information: [address] [place]  [address] [phone]        [facility] [place] Follow up on 07/15/2019.   Specialty: General Surgery Why: 10:15 am, please arrive by 9:45am for paperwork and check in process Contact information: [address] [place] [address] [phone]        PCP Follow up in 1 week(s).   Why: For repeat lab work for kidney function and monitoring of blood pressure and diabetes.          Allergies  Allergen Reactions  . Gabapentin Other (See Comments)    Hallucinations  . Percocet [Oxycodone-Acetaminophen] Other (See Comments)    Almost passed out   . Vicodin [Hydrocodone-Acetaminophen] Other (See Comments)    Almost passed out     Consultations:  General Surgery   Procedures/Studies: DG Chest 2 View  Result Date: 06/21/2019 CLINICAL DATA:  Shortness of breath.  Fever. EXAM: CHEST - 2 VIEW COMPARISON:  None. FINDINGS: The heart size and mediastinal contours are within normal limits. Both lungs  are clear. The visualized skeletal structures are unremarkable. IMPRESSION: No active cardiopulmonary disease. Electronically Signed   By: Gerome Sam III M.D   On: 06/21/2019 01:15   US Renal  Result Date: 06/22/2019 CLINICAL DATA:  Acute kidney injury. EXAM: RENAL / URINARY TRACT ULTRASOUND COMPLETE COMPARISON:  None. FINDINGS: Right Kidney: Renal measurements: 11.9 x 5.1 x 5.6 cm. = volume: 177.3 mL . Echogenicity within normal limits. No mass or hydronephrosis visualized. Left Kidney: Renal measurements: 13.3 by 5.5 x 5.5 cm = volume: 212.7 mL. Echogenicity within normal limits. No mass or hydronephrosis visualized. Bladder: Appears normal for degree of bladder distention. Other: None. IMPRESSION: 1. Normal renal sonogram. 2. No acute findings identified. Electronically Signed   By: Signa Kell M.D.   On: 06/22/2019 15:32       Subjective: Patient reports feeling better with minimum pain from the wound. Denies nausea or vomiting.   Discharge Exam: Vitals:   06/29/19 1357 06/29/19 1442  BP: (!) 173/111 (!) 167/93  Pulse: 89 97  Resp: 14 16  Temp: 98.1 F (36.7 C) 98 F (36.7 C)  SpO2: 100% 100%   Vitals:   06/28/19 1945 06/29/19 0430 06/29/19 1357 06/29/19 1442  BP: (!) 182/115 (!) 197/91 (!) 173/111 (!) 167/93  Pulse: 79 92 89 97  Resp: Temp: 98.5 F (36.9 C) 98.2 F (36.8 C) 98.1 F (36.7 C) 98 F (36.7 C)  TempSrc: Oral Oral Oral Oral  SpO2: 100% 94% 100% 100%  Weight:      Height:        General: Pt is alert, awake, not in acute distress Cardiovascular: RRR Respiratory: No respiratory distress Abdominal: Soft, NT, ND Extremities: no edema, no cyanosis. Right thigh wound with no surrounding induration, no discharge.     The results of significant diagnostics from this hospitalization (including imaging, microbiology, ancillary and laboratory) are listed below for reference.     Microbiology: Recent Results (from the past 240 hour(s))   SARS CORONAVIRUS 2 (TAT 6-24 HRS) Nasopharyngeal Nasopharyngeal Swab     Status: None   Collection Time: 06/21/19  1:50 AM   Specimen: Nasopharyngeal Swab  Result Value Ref Range Status   SARS Coronavirus 2 NEGATIVE NEGATIVE Final    Comment: (NOTE) SARS-CoV-2 target nucleic acids are NOT DETECTED. The SARS-CoV-2 RNA is generally detectable in upper and lower respiratory specimens during the acute phase of infection. Negative results do not preclude SARS-CoV-2 infection, do not  rule out co-infections with other pathogens, and should not be used as the sole basis for treatment or other patient management decisions. Negative results must be combined with clinical observations, patient history, and epidemiological information. The expected result is Negative. Fact Sheet for Patients: HairSlick.no Fact Sheet for Healthcare Providers: quierodirigir.com This test is not yet approved or cleared by the [place] FDA and  has been authorized for detection and/or diagnosis of SARS-CoV-2 by FDA under an Emergency Use Authorization (EUA). This EUA will remain  in effect (meaning this test can be used) for the duration of the COVID-19 declaration under Section 56 4(b)(1) of the Act, 21 U.S.C. section 360bbb-3(b)(1), unless the authorization is terminated or revoked sooner. Performed at [facility], [address], [place], [place] [address]   Urine culture     Status: Abnormal   Collection Time: 06/21/19  4:21 AM   Specimen: Urine, Clean Catch  Result Value Ref Range Status   Specimen Description   Final    URINE, CLEAN CATCH Performed at [facility], [address] [place], [place] [address]    Special Requests   Final    NONE Performed at [facility], [address] [place], [place] [address]    Culture MULTIPLE SPECIES PRESENT, SUGGEST RECOLLECTION (A)  Final   Report Status  06/22/2019 FINAL  Final  Culture, blood (routine x 2)     Status: None   Collection Time: 06/21/19  4:55 AM   Specimen: BLOOD  Result Value Ref Range Status   Specimen Description   Final    BLOOD RIGHT ANTECUBITAL Performed at [facility] [place], [address], [place], [place] [address]    Special Requests   Final    BOTTLES DRAWN AEROBIC AND ANAEROBIC Blood Culture results may not be optimal due to an excessive volume of blood received in culture bottles Performed at [facility], [address] [place], [place] [address]    Culture   Final    NO GROWTH 5 DAYS Performed at [facility], [address], [place], [place] [address]    Report Status 06/26/2019 FINAL  Final  Culture, blood (routine x 2)     Status: None   Collection Time: 06/21/19  4:55 AM   Specimen: BLOOD LEFT HAND  Result Value Ref Range Status   Specimen Description   Final    BLOOD LEFT HAND Performed at [facility], [address] [place], [place] [address]    Special Requests   Final    BOTTLES DRAWN AEROBIC AND ANAEROBIC Blood Culture adequate volume Performed at [facility], [address], [place], [place] [address]    Culture   Final    NO GROWTH 5 DAYS Performed at [facility], [address], [place], [place] [address]    Report Status 06/26/2019 FINAL  Final  SARS Coronavirus 2 by RT PCR (hospital order, performed in [facility] hospital lab) Nasopharyngeal Nasopharyngeal Swab     Status: None   Collection Time: 06/21/19  7:00 AM   Specimen: Nasopharyngeal Swab  Result Value Ref Range Status   SARS Coronavirus 2 NEGATIVE NEGATIVE Final    Comment: (NOTE) SARS-CoV-2 target nucleic acids are NOT DETECTED. The SARS-CoV-2 RNA is generally detectable in upper and lower respiratory specimens during the acute phase of infection. The lowest concentration of SARS-CoV-2 viral copies this assay can detect is  250 copies /  mL. A negative result does not preclude SARS-CoV-2 infection and should not be used as the sole basis for treatment or other patient management decisions.  A negative result may occur with improper specimen collection / handling, submission of specimen other than nasopharyngeal swab, presence of viral mutation(s) within the areas targeted by this assay, and inadequate number of viral copies (<250 copies / mL). A negative result must be combined with clinical observations, patient history, and epidemiological information. Fact Sheet for Patients:   BoilerBrush.com.cy Fact Sheet for Healthcare Providers: https://pope.com/ This test is not yet approved or cleared  by the [place] FDA and has been authorized for detection and/or diagnosis of SARS-CoV-2 by FDA under an Emergency Use Authorization (EUA).  This EUA will remain in effect (meaning this test can be used) for the duration of the COVID-19 declaration under Section 564(b)(1) of the Act, 21 U.S.C. section 360bbb-3(b)(1), unless the authorization is terminated or revoked sooner. Performed at [facility], [address], [place], [place] [address]   Aerobic/Anaerobic Culture (surgical/deep wound)     Status: None   Collection Time: 06/21/19  1:42 PM   Specimen: Abscess  Result Value Ref Range Status   Specimen Description   Final    ABSCESS Performed at [facility], [address] [place], [place] [address]    Special Requests   Final    NONE Performed at [facility], [address], [place], [place] [address]    Gram Stain   Final    RARE WBC PRESENT, PREDOMINANTLY PMN MODERATE GRAM POSITIVE COCCI MODERATE GRAM NEGATIVE RODS FEW GRAM POSITIVE RODS    Culture   Final    MODERATE METHICILLIN RESISTANT STAPHYLOCOCCUS AUREUS MODERATE PREVOTELLA BIVIA BETA LACTAMASE POSITIVE Performed at [facility], [address] [place], [place] [address]    Report Status 06/25/2019 FINAL  Final   Organism ID, Bacteria METHICILLIN RESISTANT STAPHYLOCOCCUS AUREUS  Final      Susceptibility   Methicillin resistant staphylococcus aureus - MIC*    CIPROFLOXACIN <=0.5 SENSITIVE Sensitive     ERYTHROMYCIN >=8 RESISTANT Resistant     GENTAMICIN <=0.5 SENSITIVE Sensitive     OXACILLIN RESISTANT Resistant     TETRACYCLINE >=16 RESISTANT Resistant     VANCOMYCIN <=0.5 SENSITIVE Sensitive     TRIMETH/SULFA <=10 SENSITIVE Sensitive     CLINDAMYCIN <=0.25 SENSITIVE Sensitive     RIFAMPIN <=0.5 SENSITIVE Sensitive     Inducible Clindamycin NEGATIVE Sensitive     * MODERATE METHICILLIN RESISTANT STAPHYLOCOCCUS AUREUS  Culture, Urine     Status: Abnormal   Collection Time: 06/22/19 10:40 AM   Specimen: Urine, Clean Catch  Result Value Ref Range Status   Specimen Description   Final    URINE, CLEAN CATCH Performed at [facility], [address] [place], [place] [address]    Special Requests   Final    NONE Performed at [facility], [address] [place], [place] [address]    Culture (A)  Final    <10,000 COLONIES/mL INSIGNIFICANT GROWTH Performed at [facility], [address] [place], [place] [address]    Report Status 06/24/2019 FINAL  Final  Surgical PCR screen     Status: Abnormal   Collection Time: 06/26/19  1:32 PM   Specimen: Nasal Mucosa; Nasal Swab  Result Value Ref Range Status   MRSA, PCR POSITIVE (A) NEGATIVE Final    Comment: RESULT CALLED TO,  READ BACK BY AND VERIFIED WITH: BETHEL,E. RN @1814  ON 12.10.2020 BY COHEN,K    Staphylococcus aureus POSITIVE (A) NEGATIVE Final    Comment: (NOTE) The Xpert SA Assay (FDA approved for NASAL specimens in patients 80 years of age and older), is one component of a comprehensive surveillance program. It is not intended to diagnose infection nor to guide or monitor treatment. Performed  at [facility], [address], [place], [place] [address]      Labs: BNP (last 3 results) No results for input(s): BNP in the last 8760 hours. Basic Metabolic Panel: Recent Labs  Lab 06/23/19 0414 06/24/19 0542 06/25/19 0543 06/26/19 0601 06/27/19 0603 06/28/19 0550 06/29/19 0532  NA 134* 137  --   --  138 140 138  K 3.6 3.4*  --   --  4.7 3.0* 3.2*  CL 105 108  --   --  102 103 99  CO2 16* 19*  --   --  22 25 28   GLUCOSE 173* 124*  --   --  319* 61* 146*  BUN 40* 33*  --   --  15 12 10   CREATININE 2.19* 2.09* 1.93* 1.67* 1.72* 1.49* 1.54*  CALCIUM 8.4* 8.1*  --   --  8.3* 8.3* 8.4*   Liver Function Tests: Recent Labs  Lab 06/24/19 0542  AST 10*  ALT 9  ALKPHOS 82  BILITOT 0.4  PROT 6.2*  ALBUMIN 2.1*   No results for input(s): LIPASE, AMYLASE in the last 168 hours. No results for input(s): AMMONIA in the last 168 hours. CBC: Recent Labs  Lab 06/24/19 0542 06/26/19 0601 06/27/19 0603 06/28/19 0550 06/29/19 0532  WBC 16.6* 15.3* 12.8* 14.3* 10.2  HGB 10.6* 10.6* 10.3* 10.2* 10.1*  HCT 33.8* 34.2* 31.1* 33.2* 32.8*  MCV 80.7 83.2 77.9* 81.8 82.0  PLT 323 386 379 475* 367   Cardiac Enzymes: No results for input(s): CKTOTAL, CKMB, CKMBINDEX, TROPONINI in the last 168 hours. BNP: Invalid input(s): POCBNP CBG: Recent Labs  Lab 06/28/19 2120 06/29/19 0423 06/29/19 0540 06/29/19 0800 06/29/19 1211  GLUCAP 296* 66* 133* 149* 189*   D-Dimer No results for input(s): DDIMER in the last 72 hours. Hgb A1c No results for input(s): HGBA1C in the last 72 hours. Lipid Profile No results for input(s): CHOL, HDL, LDLCALC, TRIG, CHOLHDL, LDLDIRECT in the last 72 hours. Thyroid function studies No results for input(s): TSH, T4TOTAL, T3FREE, THYROIDAB in the last 72 hours.  Invalid input(s): FREET3 Anemia work up No results for input(s): VITAMINB12, FOLATE, FERRITIN, TIBC, IRON, RETICCTPCT in the last 72 hours. Urinalysis    Component  Value Date/Time   COLORURINE YELLOW 06/21/2019 0518   APPEARANCEUR HAZY (A) 06/21/2019 0518   LABSPEC 1.030 06/21/2019 0518   PHURINE 5.0 06/21/2019 0518   GLUCOSEU >=500 (A) 06/21/2019 0518   HGBUR MODERATE (A) 06/21/2019 0518   BILIRUBINUR NEGATIVE 06/21/2019 0518   KETONESUR 80 (A) 06/21/2019 0518   PROTEINUR 30 (A) 06/21/2019 0518   NITRITE NEGATIVE 06/21/2019 0518   LEUKOCYTESUR NEGATIVE 06/21/2019 0518   Sepsis Labs Invalid input(s): PROCALCITONIN,  WBC,  LACTICIDVEN Microbiology Recent Results (from the past 240 hour(s))  SARS CORONAVIRUS 2 (TAT 6-24 HRS) Nasopharyngeal Nasopharyngeal Swab     Status: None   Collection Time: 06/21/19  1:50 AM   Specimen: Nasopharyngeal Swab  Result Value Ref Range Status   SARS Coronavirus 2 NEGATIVE NEGATIVE Final    Comment: (NOTE) SARS-CoV-2 target nucleic acids are NOT DETECTED. The SARS-CoV-2  RNA is generally detectable in upper and lower respiratory specimens during the acute phase of infection. Negative results do not preclude SARS-CoV-2 infection, do not rule out co-infections with other pathogens, and should not be used as the sole basis for treatment or other patient management decisions. Negative results must be combined with clinical observations, patient history, and epidemiological information. The expected result is Negative. Fact Sheet for Patients: HairSlick.no Fact Sheet for Healthcare Providers: quierodirigir.com This test is not yet approved or cleared by the [place] FDA and  has been authorized for detection and/or diagnosis of SARS-CoV-2 by FDA under an Emergency Use Authorization (EUA). This EUA will remain  in effect (meaning this test can be used) for the duration of the COVID-19 declaration under Section 56 4(b)(1) of the Act, 21 U.S.C. section 360bbb-3(b)(1), unless the authorization is terminated or revoked sooner. Performed at [facility], [address], [place], [place] [address]   Urine culture     Status: Abnormal   Collection Time: 06/21/19  4:21 AM   Specimen: Urine, Clean Catch  Result Value Ref Range Status   Specimen Description   Final    URINE, CLEAN CATCH Performed at [facility], [address], [place], [place] [address]    Special Requests   Final    NONE Performed at [facility], [address] [place], [place] [address]    Culture MULTIPLE SPECIES PRESENT, SUGGEST RECOLLECTION (A)  Final   Report Status 06/22/2019 FINAL  Final  Culture, blood (routine x 2)     Status: None   Collection Time: 06/21/19  4:55 AM   Specimen: BLOOD  Result Value Ref Range Status   Specimen Description   Final    BLOOD RIGHT ANTECUBITAL Performed at [facility], [address] [place], [place] [address]    Special Requests   Final    BOTTLES DRAWN AEROBIC AND ANAEROBIC Blood Culture results may not be optimal due to an excessive volume of blood received in culture bottles Performed at [facility], [address], [place], [place] [address]    Culture   Final    NO GROWTH 5 DAYS Performed at [facility], [address], [place], [place] [address]    Report Status 06/26/2019 FINAL  Final  Culture, blood (routine x 2)     Status: None   Collection Time: 06/21/19  4:55 AM   Specimen: BLOOD LEFT HAND  Result Value Ref Range Status   Specimen Description   Final    BLOOD LEFT HAND Performed at [facility], [address] [place], [place] [address]    Special Requests   Final    BOTTLES DRAWN AEROBIC AND ANAEROBIC Blood Culture adequate volume Performed at [facility], [address] [place], [place] [address]    Culture   Final    NO GROWTH 5 DAYS Performed at [facility], [address] [place], [place] [address]    Report Status 06/26/2019 FINAL  Final  SARS Coronavirus 2 by  RT PCR (hospital order, performed in [facility] hospital lab) Nasopharyngeal Nasopharyngeal Swab     Status: None   Collection Time: 06/21/19  7:00 AM   Specimen: Nasopharyngeal Swab  Result Value Ref Range Status   SARS Coronavirus 2 NEGATIVE NEGATIVE Final    Comment: (NOTE) SARS-CoV-2 target nucleic acids are NOT DETECTED. The SARS-CoV-2 RNA is generally detectable in upper  and lower respiratory specimens during the acute phase of infection. The lowest concentration of SARS-CoV-2 viral copies this assay can detect is 250 copies / mL. A negative result does not preclude SARS-CoV-2 infection and should not be used as the sole basis for treatment or other patient management decisions.  A negative result may occur with improper specimen collection / handling, submission of specimen other than nasopharyngeal swab, presence of viral mutation(s) within the areas targeted by this assay, and inadequate number of viral copies (<250 copies / mL). A negative result must be combined with clinical observations, patient history, and epidemiological information. Fact Sheet for Patients:   BoilerBrush.com.cy Fact Sheet for Healthcare Providers: https://pope.com/ This test is not yet approved or cleared  by the [place] FDA and has been authorized for detection and/or diagnosis of SARS-CoV-2 by FDA under an Emergency Use Authorization (EUA).  This EUA will remain in effect (meaning this test can be used) for the duration of the COVID-19 declaration under Section 564(b)(1) of the Act, 21 U.S.C. section 360bbb-3(b)(1), unless the authorization is terminated or revoked sooner. Performed at [facility], [address], [place], [place] [address]   Aerobic/Anaerobic Culture (surgical/deep wound)     Status: None   Collection Time: 06/21/19  1:42 PM   Specimen: Abscess  Result Value Ref Range Status   Specimen Description   Final     ABSCESS Performed at [facility], [address], [place], [place] [address]    Special Requests   Final    NONE Performed at [facility], [address], [place], [place] [address]    Gram Stain   Final    RARE WBC PRESENT, PREDOMINANTLY PMN MODERATE GRAM POSITIVE COCCI MODERATE GRAM NEGATIVE RODS FEW GRAM POSITIVE RODS    Culture   Final    MODERATE METHICILLIN RESISTANT STAPHYLOCOCCUS AUREUS MODERATE PREVOTELLA BIVIA BETA LACTAMASE POSITIVE Performed at [facility], [address], [place], [place] [address]    Report Status 06/25/2019 FINAL  Final   Organism ID, Bacteria METHICILLIN RESISTANT STAPHYLOCOCCUS AUREUS  Final      Susceptibility   Methicillin resistant staphylococcus aureus - MIC*    CIPROFLOXACIN <=0.5 SENSITIVE Sensitive     ERYTHROMYCIN >=8 RESISTANT Resistant     GENTAMICIN <=0.5 SENSITIVE Sensitive     OXACILLIN RESISTANT Resistant     TETRACYCLINE >=16 RESISTANT Resistant     VANCOMYCIN <=0.5 SENSITIVE Sensitive     TRIMETH/SULFA <=10 SENSITIVE Sensitive     CLINDAMYCIN <=0.25 SENSITIVE Sensitive     RIFAMPIN <=0.5 SENSITIVE Sensitive     Inducible Clindamycin NEGATIVE Sensitive     * MODERATE METHICILLIN RESISTANT STAPHYLOCOCCUS AUREUS  Culture, Urine     Status: Abnormal   Collection Time: 06/22/19 10:40 AM   Specimen: Urine, Clean Catch  Result Value Ref Range Status   Specimen Description   Final    URINE, CLEAN CATCH Performed at [facility], [address], [place], [place] [address]    Special Requests   Final    NONE Performed at [facility] [address] [place], [place] [address]    Culture (A)  Final    <10,000 COLONIES/mL INSIGNIFICANT GROWTH Performed at [facility], [address], [place], [place] [address]    Report Status 06/24/2019 FINAL  Final  Surgical PCR screen     Status: Abnormal   Collection Time: 06/26/19  1:32 PM  Specimen: Nasal Mucosa; Nasal Swab  Result Value Ref Range Status   MRSA, PCR POSITIVE (A) NEGATIVE Final    Comment: RESULT CALLED TO, READ BACK BY AND VERIFIED WITH: BETHEL,E. RN @1814  ON 12.10.2020 BY COHEN,K    Staphylococcus aureus POSITIVE (A) NEGATIVE Final    Comment: (NOTE) The Xpert SA Assay (FDA approved for NASAL specimens in patients 17 years of age and older), is one component of a comprehensive surveillance program. It is not intended to diagnose infection nor to guide or monitor treatment. Performed at [facility], [place] [address] [place], [place] [address]      Time coordinating discharge: Over 33 minutes  SIGNED:   Blain Pais, MD  [facility] 06/29/2019, 2:54 PM   If 7PM-7AM, please contact night-coverage [email or web address] Password [email or web address]

## 2019-07-28 ENCOUNTER — Other Ambulatory Visit: Payer: Self-pay | Admitting: Chiropractor

## 2019-07-28 ENCOUNTER — Ambulatory Visit
Admission: RE | Admit: 2019-07-28 | Discharge: 2019-07-28 | Disposition: A | Discharge status: Home / Self Care | Payer: No Typology Code available for payment source | Source: Ambulatory Visit | Attending: Chiropractor | Admitting: Chiropractor

## 2019-07-28 DIAGNOSIS — M545 Low back pain, unspecified: Secondary | ICD-10-CM

## 2019-07-28 DIAGNOSIS — M542 Cervicalgia: Secondary | ICD-10-CM

## 2019-08-12 ENCOUNTER — Encounter: Payer: Self-pay | Admitting: Dietician

## 2019-08-12 ENCOUNTER — Encounter: Payer: 59 | Attending: Internal Medicine | Admitting: Dietician

## 2019-08-12 ENCOUNTER — Other Ambulatory Visit: Payer: Self-pay

## 2019-08-12 DIAGNOSIS — E1365 Other specified diabetes mellitus with hyperglycemia: Secondary | ICD-10-CM | POA: Insufficient documentation

## 2019-08-12 DIAGNOSIS — E1329 Other specified diabetes mellitus with other diabetic kidney complication: Secondary | ICD-10-CM | POA: Insufficient documentation

## 2019-08-12 DIAGNOSIS — IMO0002 Reserved for concepts with insufficient information to code with codable children: Secondary | ICD-10-CM

## 2019-08-12 NOTE — Progress Notes (Signed)
Diabetes Self-Management Education  Visit Type: First/Initial  Appt. Start Time: 9:15 Appt. End Time: 10:15  08/12/2019  Amber Jensen, identified by name and date of birth, is a 39 y.o. female with a diagnosis of Diabetes: Type 2.   ASSESSMENT  Height 5\' 6"  (1.676 m), weight 294 lb 9.6 oz (133.6 kg). Body mass index is 47.55 kg/m.   Pt reports always being tired, doesn't sleep regularly. Pt doesn't report being under any stress, reports her work schedule is probably to blame. Pt reports reacting differently to many different medications. Pt reports on adhearing to low carb diet, mentioned starting keto soon. Pt reports a back injury that has kept her from exercising Pt leads a high demand, high stress life. Admits she needs to take care of herself more. Pt moved her in June 29th from July 01, was stationed with the service. Pt reports hyperglycemia zero times since release from hospital Pt reports missing a lot of meals. Didn't eat 6 am - 6 pm yesterday, hasn't eaten yet today. Reports binge eating when she gets home. Pt reports usually snacking. Works third shift.   Diabetes Self-Management Education - 08/12/19 0927      Visit Information   Visit Type  First/Initial      Initial Visit   Diabetes Type  Type 2    Are you currently following a meal plan?  Yes    What type of meal plan do you follow?  low sodium, low carb. Given to her at the hospital.    Are you taking your medications as prescribed?  Yes    Date Diagnosed  2007      Health Coping   How would you rate your overall health?  Good      Psychosocial Assessment   Patient Belief/Attitude about Diabetes  Motivated to manage diabetes    Self-care barriers  None    Self-management support  Family    Patient Concerns  Nutrition/Meal planning    Special Needs  None    Preferred Learning Style  No preference indicated    Learning Readiness  Ready    How often do you need to have someone help you when you read  instructions, pamphlets, or other written materials from your doctor or pharmacy?  1 - Never    What is the last grade level you completed in school?  Bachelors degree in education      Pre-Education Assessment   Patient understands the diabetes disease and treatment process.  Needs Review    Patient understands incorporating nutritional management into lifestyle.  Needs Instruction    Patient undertands incorporating physical activity into lifestyle.  Needs Instruction    Patient understands using medications safely.  Needs Review    Patient understands monitoring blood glucose, interpreting and using results  Needs Review    Patient understands prevention, detection, and treatment of acute complications.  Demonstrates understanding / competency    Patient understands prevention, detection, and treatment of chronic complications.  Needs Instruction    Patient understands how to develop strategies to address psychosocial issues.  Needs Instruction    Patient understands how to develop strategies to promote health/change behavior.  Needs Instruction      Complications   Last HgB A1C per patient/outside source  11 %    How often do you check your blood sugar?  1-2 times/day    Fasting Blood glucose range (mg/dL)  2008    Postprandial Blood glucose range (mg/dL)  16-109  Number of hypoglycemic episodes per month  3    Can you tell when your blood sugar is low?  Yes    What do you do if your blood sugar is low?  Takes glucose tablets, 2 out of 4    Number of hyperglycemic episodes per week  0    Have you had a dilated eye exam in the past 12 months?  No    Have you had a dental exam in the past 12 months?  No    Are you checking your feet?  Yes    How many days per week are you checking your feet?  7      Dietary Intake   Breakfast  skips    Snack (morning)  skips    Lunch  skips    Snack (afternoon)  skips    Dinner  Chicken salad, fried chicken, cucumbers, cheese eggs, tomatoes.     Snack (evening)  none reported    Beverage(s)  Water, pineapple juice mixed with water.      Exercise   Exercise Type  ADL's      Patient Education   Previous Diabetes Education  Yes (please comment)   2018   Disease state   Definition of diabetes, type 1 and 2, and the diagnosis of diabetes    Nutrition management   Role of diet in the treatment of diabetes and the relationship between the three main macronutrients and blood glucose level;Food label reading, portion sizes and measuring food.;Carbohydrate counting;Meal timing in regards to the patients' current diabetes medication.    Medications  Reviewed patients medication for diabetes, action, purpose, timing of dose and side effects.    Monitoring  Taught/evaluated SMBG meter.    Acute complications  Taught treatment of hypoglycemia - the 15 rule.    Chronic complications  Assessed and discussed foot care and prevention of foot problems    Psychosocial adjustment  Helped patient identify a support system for diabetes management;Worked with patient to identify barriers to care and solutions;Brainstormed with patient on coping mechanisms for social situations, getting support from significant others, dealing with feelings about diabetes    Personal strategies to promote health  Lifestyle issues that need to be addressed for better diabetes care;Helped patient develop diabetes management plan for (enter comment)   Eating consistently throughout the day     Individualized Goals (developed by patient)   Nutrition  Follow meal plan discussed    Physical Activity  Not Applicable    Medications  take my medication as prescribed    Monitoring   test my blood glucose as discussed;test blood glucose pre and post meals as discussed    Health Coping  Not Applicable      Post-Education Assessment   Patient understands the diabetes disease and treatment process.  Demonstrates understanding / competency    Patient understands incorporating  nutritional management into lifestyle.  Demonstrates understanding / competency    Patient undertands incorporating physical activity into lifestyle.  Needs Review    Patient understands using medications safely.  Needs Review    Patient understands monitoring blood glucose, interpreting and using results  Demonstrates understanding / competency    Patient understands prevention, detection, and treatment of acute complications.  Demonstrates understanding / competency    Patient understands prevention, detection, and treatment of chronic complications.  Needs Review    Patient understands how to develop strategies to address psychosocial issues.  Needs Review    Patient understands how to  develop strategies to promote health/change behavior.  Needs Review      Outcomes   Expected Outcomes  Demonstrated interest in learning. Expect positive outcomes    Future DMSE  4-6 wks    Program Status  Not Completed       Individualized Plan for Diabetes Self-Management Training:   Learning Objective:  Patient will have a greater understanding of diabetes self-management. Patient education plan is to attend individual and/or group sessions per assessed needs and concerns.   Plan:    Work towards eating consistently throughout the day!  Recommend 3-4 Starburst or 4 oz glass of pineapple juice if experiencing a low.    Follow up with necessity for Eye exams, foot care, and kidney health.   Examine pt rationale about what happens if she does eat. Does she think her diabetes will get worse? Gain weight? Why is she missing meals?  Expected Outcomes:  Demonstrated interest in learning. Expect positive outcomes  Education material provided: ADA - How to Thrive: A Guide for Your Journey with Diabetes and Meal plan card  If problems or questions, patient to contact team via:  Phone and Email  Future DSME appointment: 4-6 wks

## 2019-09-09 ENCOUNTER — Ambulatory Visit: Payer: 59 | Admitting: Dietician

## 2021-02-24 ENCOUNTER — Encounter (HOSPITAL_COMMUNITY): Payer: Self-pay | Admitting: Emergency Medicine

## 2021-02-24 ENCOUNTER — Emergency Department (HOSPITAL_COMMUNITY): Payer: No Typology Code available for payment source

## 2021-02-24 ENCOUNTER — Emergency Department (HOSPITAL_COMMUNITY)
Admission: EM | Admit: 2021-02-24 | Discharge: 2021-02-25 | Disposition: A | Discharge status: Home / Self Care | Payer: No Typology Code available for payment source | Attending: Emergency Medicine | Admitting: Emergency Medicine

## 2021-02-24 ENCOUNTER — Other Ambulatory Visit: Payer: Self-pay

## 2021-02-24 DIAGNOSIS — Z7984 Long term (current) use of oral hypoglycemic drugs: Secondary | ICD-10-CM | POA: Diagnosis not present

## 2021-02-24 DIAGNOSIS — E785 Hyperlipidemia, unspecified: Secondary | ICD-10-CM | POA: Diagnosis not present

## 2021-02-24 DIAGNOSIS — I10 Essential (primary) hypertension: Secondary | ICD-10-CM | POA: Diagnosis not present

## 2021-02-24 DIAGNOSIS — K30 Functional dyspepsia: Secondary | ICD-10-CM | POA: Insufficient documentation

## 2021-02-24 DIAGNOSIS — Z794 Long term (current) use of insulin: Secondary | ICD-10-CM | POA: Diagnosis not present

## 2021-02-24 DIAGNOSIS — Z79899 Other long term (current) drug therapy: Secondary | ICD-10-CM | POA: Insufficient documentation

## 2021-02-24 DIAGNOSIS — E1169 Type 2 diabetes mellitus with other specified complication: Secondary | ICD-10-CM | POA: Insufficient documentation

## 2021-02-24 DIAGNOSIS — R072 Precordial pain: Secondary | ICD-10-CM | POA: Insufficient documentation

## 2021-02-24 DIAGNOSIS — R079 Chest pain, unspecified: Secondary | ICD-10-CM | POA: Diagnosis present

## 2021-02-24 LAB — LIPASE, BLOOD: Lipase: 34 U/L (ref 11–51)

## 2021-02-24 LAB — COMPREHENSIVE METABOLIC PANEL
ALT: 12 U/L (ref 0–44)
AST: 13 U/L — ABNORMAL LOW (ref 15–41)
Albumin: 4 g/dL (ref 3.5–5.0)
Alkaline Phosphatase: 65 U/L (ref 38–126)
Anion gap: 11 (ref 5–15)
BUN: 22 mg/dL — ABNORMAL HIGH (ref 6–20)
CO2: 25 mmol/L (ref 22–32)
Calcium: 9.3 mg/dL (ref 8.9–10.3)
Chloride: 100 mmol/L (ref 98–111)
Creatinine, Ser: 0.9 mg/dL (ref 0.44–1.00)
GFR, Estimated: >60 mL/min (ref >60)
Glucose, Bld: 204 mg/dL — ABNORMAL HIGH (ref 70–99)
Potassium: 3.8 mmol/L (ref 3.5–5.1)
Sodium: 136 mmol/L (ref 135–145)
Total Bilirubin: 0.4 mg/dL (ref 0.3–1.2)
Total Protein: 8.1 g/dL (ref 6.5–8.1)

## 2021-02-24 LAB — CBC
HCT: 41.1 % (ref 36.0–46.0)
Hemoglobin: 12.8 g/dL (ref 12.0–15.0)
MCH: 24.7 pg — ABNORMAL LOW (ref 26.0–34.0)
MCHC: 31.1 g/dL (ref 30.0–36.0)
MCV: 79.2 fL — ABNORMAL LOW (ref 80.0–100.0)
Platelets: 353 10*3/uL (ref 150–400)
RBC: 5.19 MIL/uL — ABNORMAL HIGH (ref 3.87–5.11)
RDW: 13.8 % (ref 11.5–15.5)
WBC: 9.6 10*3/uL (ref 4.0–10.5)
nRBC: 0 % (ref 0.0–0.2)

## 2021-02-24 LAB — TROPONIN I (HIGH SENSITIVITY): Troponin I (High Sensitivity): 3 ng/L (ref <18)

## 2021-02-24 LAB — I-STAT BETA HCG BLOOD, ED (MC, WL, AP ONLY): I-stat hCG, quantitative: <5 m[IU]/mL (ref <5)

## 2021-02-24 NOTE — ED Notes (Signed)
Called lab regarding changing BMP to CMP and adding on a lipase.

## 2021-02-24 NOTE — ED Provider Notes (Signed)
Emergency Medicine Provider Triage Evaluation Note  Adah Gripp , a 40 y.o. female  was evaluated in triage.  Pt complains of chest pain. Reports nausea as well  Review of Systems  Positive: Chest pain, nausea, sob Negative: fever  Physical Exam  BP (!) 185/104 Comment: pt has hx of hypertension and has not been taking her lisinopril  Pulse 88   Temp 98.3 F (36.8 C) (Oral)   Resp 18   Ht 5\' 6"  (1.676 m)   Wt 122.5 kg   LMP 02/02/2021   SpO2 100%   BMI 43.58 kg/m  Gen:   Awake, no distress   Resp:  Normal effort  MSK:   Moves extremities without difficulty   Medical Decision Making  Medically screening exam initiated at 8:02 PM.  Appropriate orders placed.  Symphoni Martinson was informed that the remainder of the evaluation will be completed by another provider, this initial triage assessment does not replace that evaluation, and the importance of remaining in the ED until their evaluation is complete.     02/04/2021 02/24/21 04/26/21, MD 02/27/21 1345

## 2021-02-24 NOTE — ED Triage Notes (Signed)
Pt has chest pain in her right mid chest that began a few days ago. Pt states it feels like indigestion, but that it is sharper. Pt states pain comes and goes. Pt states she has a little bit of shortness of breath

## 2021-02-25 LAB — TROPONIN I (HIGH SENSITIVITY): Troponin I (High Sensitivity): 2 ng/L (ref <18)

## 2021-02-25 MED ORDER — PANTOPRAZOLE SODIUM 40 MG PO TBEC: 40.0000 mg | DELAYED_RELEASE_TABLET | Freq: Every day | ORAL | 0 refills | Status: AC

## 2021-02-25 MED ORDER — ALUM & MAG HYDROXIDE-SIMETH 200-200-20 MG/5ML PO SUSP
30.0000 mL | Freq: Once | ORAL | Status: AC
  Administered 2021-02-25: 30 mL via ORAL
  Filled 2021-02-25: qty 30

## 2021-02-25 NOTE — ED Notes (Signed)
Pt states full improvement in chest pain since Maalox

## 2021-02-25 NOTE — ED Provider Notes (Signed)
[facility]-EMERGENCY DEPT Provider Note   CSN: [id number] Arrival date & time: 02/24/21  1911     History Chief Complaint  Patient presents with   Chest Pain    Amber Jensen is a 40 y.o. female.  The history is provided by the patient.  Chest Pain Amber Jensen is a 40 y.o. female who presents to the Emergency Department complaining of chest pain.  She started feeling unwell two days ago.  She has a sharp, pin like feeling in her right upper chest. Pain radiates to her back at times.  Her stomach feels upset at times as well.  Has increased bowel movements for the last two days.  No diarrhea, nausea.  No sob.  Feels palpitations at times over the last two days.  No fever, cough. Had bloating. No leg pain/swelling.  No recent surgery/procedure.  No hormone use. No history of DVT/PE.    Past Medical History:  Diagnosis Date   Diabetes mellitus without complication (HCC)    Hyperlipidemia    Hypertension    Sleep apnea     Patient Active Problem List   Diagnosis Date Noted   Acute renal failure (ARF) (HCC) 06/25/2019   Sepsis (HCC) 06/25/2019   DM (diabetes mellitus), secondary, uncontrolled, with renal complications (HCC) 06/24/2019   Essential hypertension 06/24/2019   Abscess 06/21/2019   Cellulitis of right lower extremity    Hyperglycemia     Past Surgical History:  Procedure Laterality Date   INCISION AND DRAINAGE OF WOUND N/A 06/21/2019   Procedure: INCISION AND DRAINAGE ABSCESS groin/thigh;  Surgeon: Abigail Miyamoto, MD;  Location: [facility];  Service: General;  Laterality: N/A;   INCISION AND DRAINAGE PERIRECTAL ABSCESS Right 06/26/2019   Procedure: IRRIGATION AND DEBRIDEMENT right thigh  ABSCESS;  Surgeon: Griselda Miner, MD;  Location: [facility];  Service: General;  Laterality: Right;     OB History   No obstetric history on file.     Family History  Problem Relation Age of Onset   Hypertension Father    Hypertension Paternal Aunt     Diabetes Paternal Aunt    Hypertension Paternal Uncle    Diabetes Paternal Uncle     Social History   Tobacco Use   Smoking status: Never   Smokeless tobacco: Never  Vaping Use   Vaping Use: Never used  Substance Use Topics   Alcohol use: Not Currently   Drug use: Not Currently    Home Medications Prior to Admission medications   Medication Sig Start Date End Date Taking? Authorizing Provider  pantoprazole (PROTONIX) 40 MG tablet Take 1 tablet (40 mg total) by mouth daily. 02/25/21  Yes Tilden Fossa, MD  acetaminophen (TYLENOL) 325 MG tablet Take 2 tablets (650 mg total) by mouth every 6 (six) hours as needed for mild pain or fever. 06/27/19   Barnetta Chapel, PA-C  amLODipine (NORVASC) 10 MG tablet Take 1 tablet (10 mg total) by mouth daily. 06/30/19   Ky Barban, MD  Ascorbic Acid (VITAMIN C) 1000 MG tablet Take 1,000 mg by mouth 3 (three) times daily.    [provider]  Biotin 1000 MCG tablet Take 1,000 mcg by mouth daily.    [provider]  Chlorhexidine Gluconate Cloth 2 % PADS Apply 6 each topically daily at 6 (six) AM. 06/30/19   Ky Barban, MD  gabapentin (NEURONTIN) 100 MG capsule Take 100 mg by mouth 3 (three) times daily as needed (pain).    [provider]  insulin detemir (LEVEMIR) 100 UNIT/ML injection Inject 25-45 Units into the skin See admin instructions. 25 units in the am and 45 units at bedtime    [provider]  metFORMIN (GLUCOPHAGE-XR) 500 MG 24 hr tablet Take 500 mg by mouth daily with breakfast.    [provider]  Multiple Vitamin (MULTIVITAMIN WITH MINERALS) TABS tablet Take 1 tablet by mouth daily.    [provider]  ondansetron (ZOFRAN) 4 MG tablet Take 1 tablet (4 mg total) by mouth every 8 (eight) hours as needed for nausea or vomiting. 06/29/19   Ky Barban, MD  pioglitazone (ACTOS) 45 MG tablet Take 45 mg by mouth daily.    [provider]  Probiotic  Product (PROBIOTIC DAILY) CAPS 1 capsule by mouth twice daily while taking antibiotics 06/29/19   Ky Barban, MD  traMADol (ULTRAM) 50 MG tablet Take 1-2 tablets (50-100 mg total) by mouth every 6 (six) hours as needed for moderate pain or severe pain. 06/27/19   Barnetta Chapel, PA-C    Allergies    Gabapentin, Percocet [oxycodone-acetaminophen], and Vicodin [hydrocodone-acetaminophen]  Review of Systems   Review of Systems  Cardiovascular:  Positive for chest pain.  All other systems reviewed and are negative.  Physical Exam Updated Vital Signs BP (!) 133/120   Pulse 96   Temp 98.3 F (36.8 C) (Oral)   Resp 20   Ht 5\' 6"  (1.676 m)   Wt 122.5 kg   LMP 02/02/2021   SpO2 96%   BMI 43.58 kg/m   Physical Exam Vitals and nursing note reviewed.  Constitutional:      Appearance: She is well-developed.  HENT:     Head: Normocephalic and atraumatic.  Cardiovascular:     Rate and Rhythm: Normal rate and regular rhythm.     Heart sounds: No murmur heard. Pulmonary:     Effort: Pulmonary effort is normal. No respiratory distress.     Breath sounds: Normal breath sounds.  Abdominal:     Palpations: Abdomen is soft.     Tenderness: There is no guarding or rebound.     Comments: Mild epigastric tenderness  Musculoskeletal:        General: No swelling or tenderness.     Comments: 2+ DP pulses bilaterally  Skin:    General: Skin is warm and dry.  Neurological:     Mental Status: She is alert and oriented to person, place, and time.  Psychiatric:        Behavior: Behavior normal.    ED Results / Procedures / Treatments   Labs (all labs ordered are listed, but only abnormal results are displayed) Labs Reviewed  CBC - Abnormal; Notable for the following components:      Result Value   RBC 5.19 (*)    MCV 79.2 (*)    MCH 24.7 (*)    All other components within normal limits  COMPREHENSIVE METABOLIC PANEL - Abnormal; Notable for the following components:   Glucose,  Bld 204 (*)    BUN 22 (*)    AST 13 (*)    All other components within normal limits  LIPASE, BLOOD  I-STAT BETA HCG BLOOD, ED ([facility], [facility], [facility] ONLY)  TROPONIN I (HIGH SENSITIVITY)  TROPONIN I (HIGH SENSITIVITY)    EKG EKG Interpretation  Date/Time:  Thursday February 24 2021 19:17:49 EDT Ventricular Rate:  93 PR Interval:  161 QRS Duration: 84 QT Interval:  338 QTC Calculation: 421 R Axis:  66 Text Interpretation: Sinus rhythm Borderline T wave abnormalities 12 Lead; Mason-Likar Confirmed by Ernie Avena ([id number]) on 02/24/2021 9:20:12 PM  Radiology DG Chest 2 View  Result Date: 02/24/2021 CLINICAL DATA:  Right-sided chest pain which is intermittent. EXAM: CHEST - 2 VIEW COMPARISON:  06/21/2019 FINDINGS: Heart size is normal. Mediastinal shadows are normal. The lungs are clear. No bronchial thickening. No infiltrate, mass, effusion or collapse. Pulmonary vascularity is normal. No bony abnormality. IMPRESSION: Normal chest Electronically Signed   By: Paulina Fusi M.D.   On: 02/24/2021 19:40    Procedures Procedures   Medications Ordered in ED Medications  alum & mag hydroxide-simeth (MAALOX/MYLANTA) 200-200-20 MG/5ML suspension 30 mL (30 mLs Oral Given 02/25/21 0142)    ED Course  I have reviewed the triage vital signs and the nursing notes.  Pertinent labs & imaging results that were available during my care of the patient were reviewed by me and considered in my medical decision making (see chart for details).    MDM Rules/Calculators/A&P                          patient with history of diabetes, hypertension here for evaluation of two days of sharp right sided chest pain. Symptoms improved after medications in the emergency department. Troponins are negative times two. She is perk negative. Presentation is not consistent with ACS, PE, dissection. Discussed with patient home care for chest pain, likely reflux -related. Discussed outpatient follow-up as well as return  precautions.  Final Clinical Impression(s) / ED Diagnoses Final diagnoses:  Precordial pain  Indigestion    Rx / DC Orders ED Discharge Orders          Ordered    pantoprazole (PROTONIX) 40 MG tablet  Daily        02/25/21 0304             Tilden Fossa, MD 02/25/21 [phone]

## 2022-04-19 IMAGING — CR DG CHEST 2V
2 series · 2 of 2 positions shown · non-contrast
Comparison: 06/21/2019

CLINICAL DATA: Right-sided chest pain which is intermittent.

EXAM:
CHEST - 2 VIEW

[w chest pa]
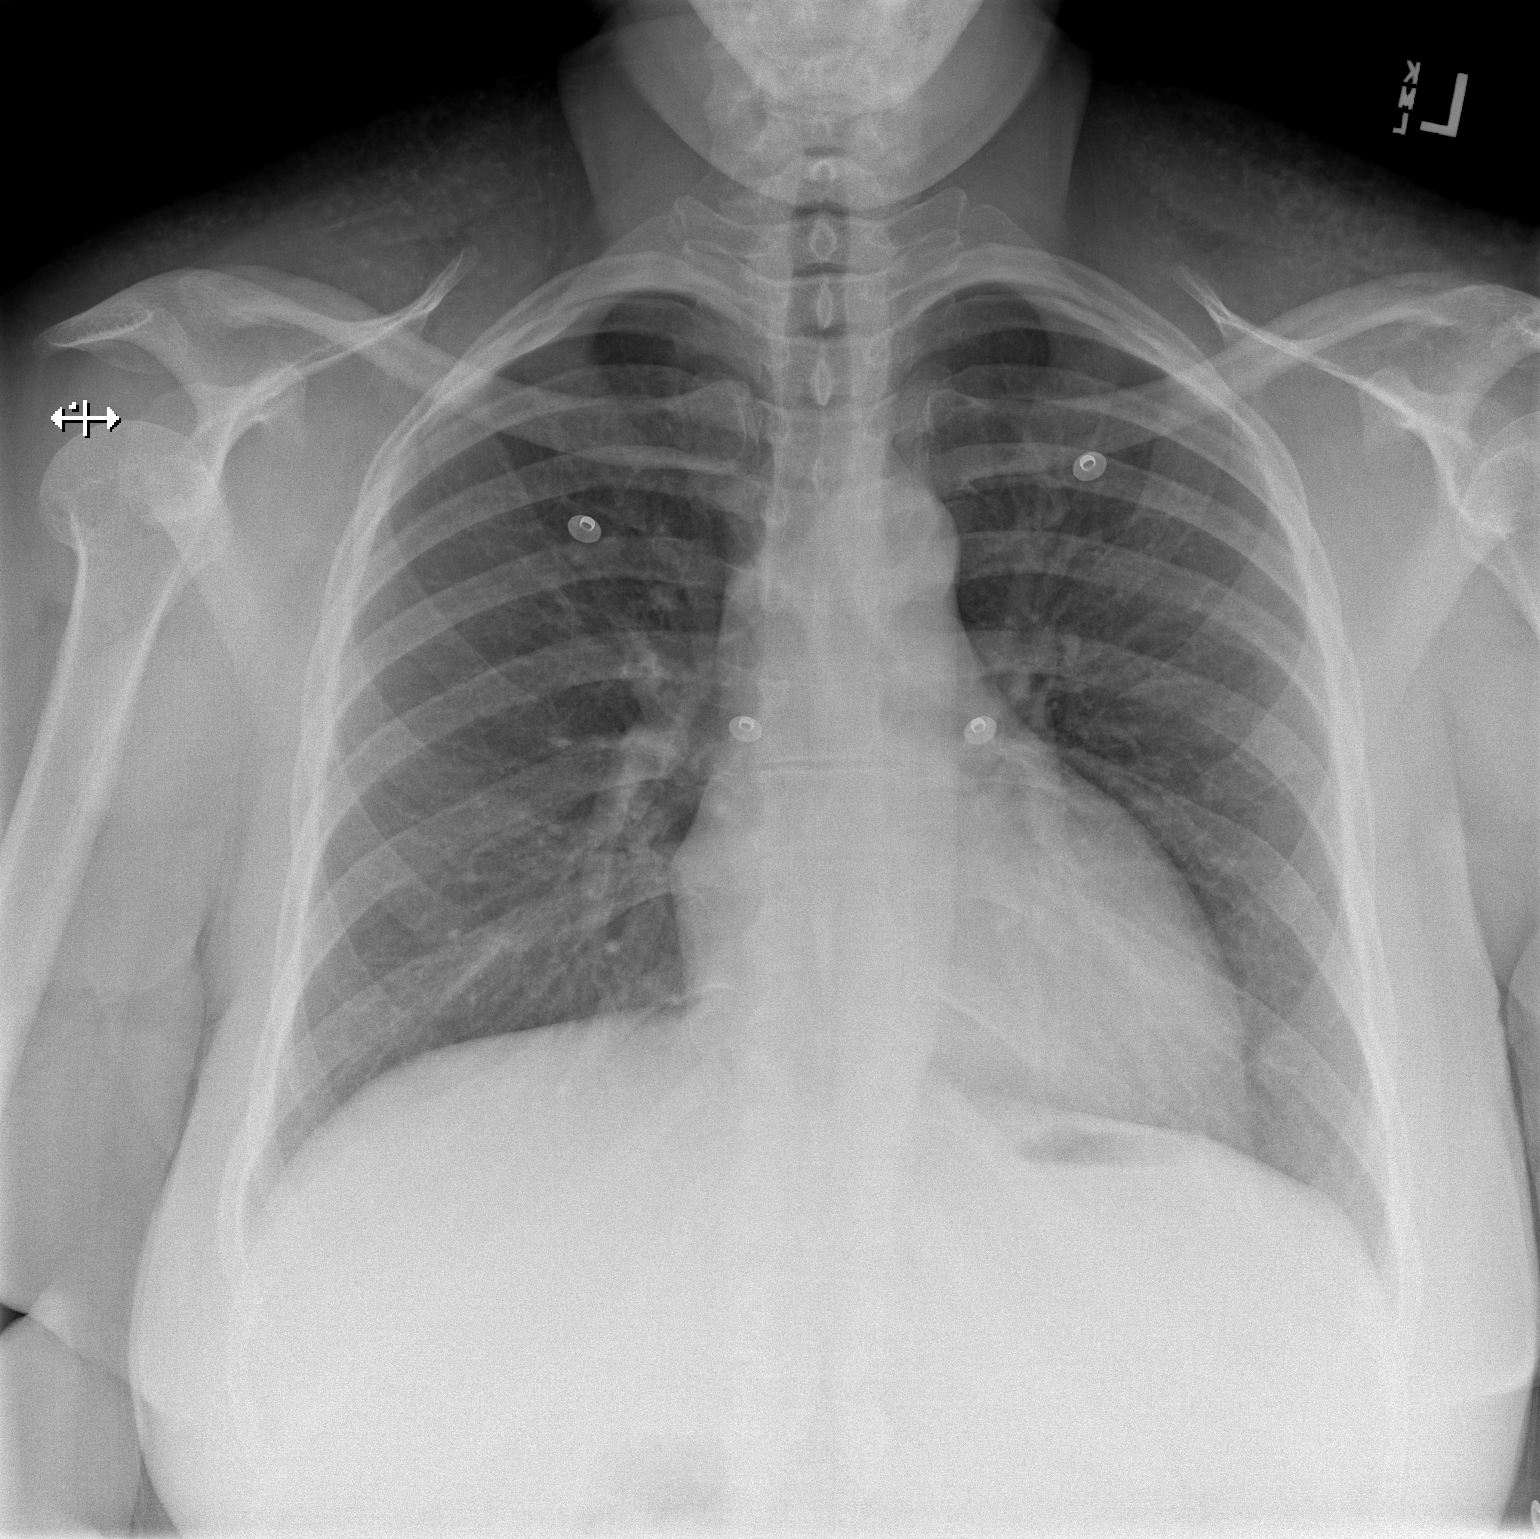

[w chest lat]
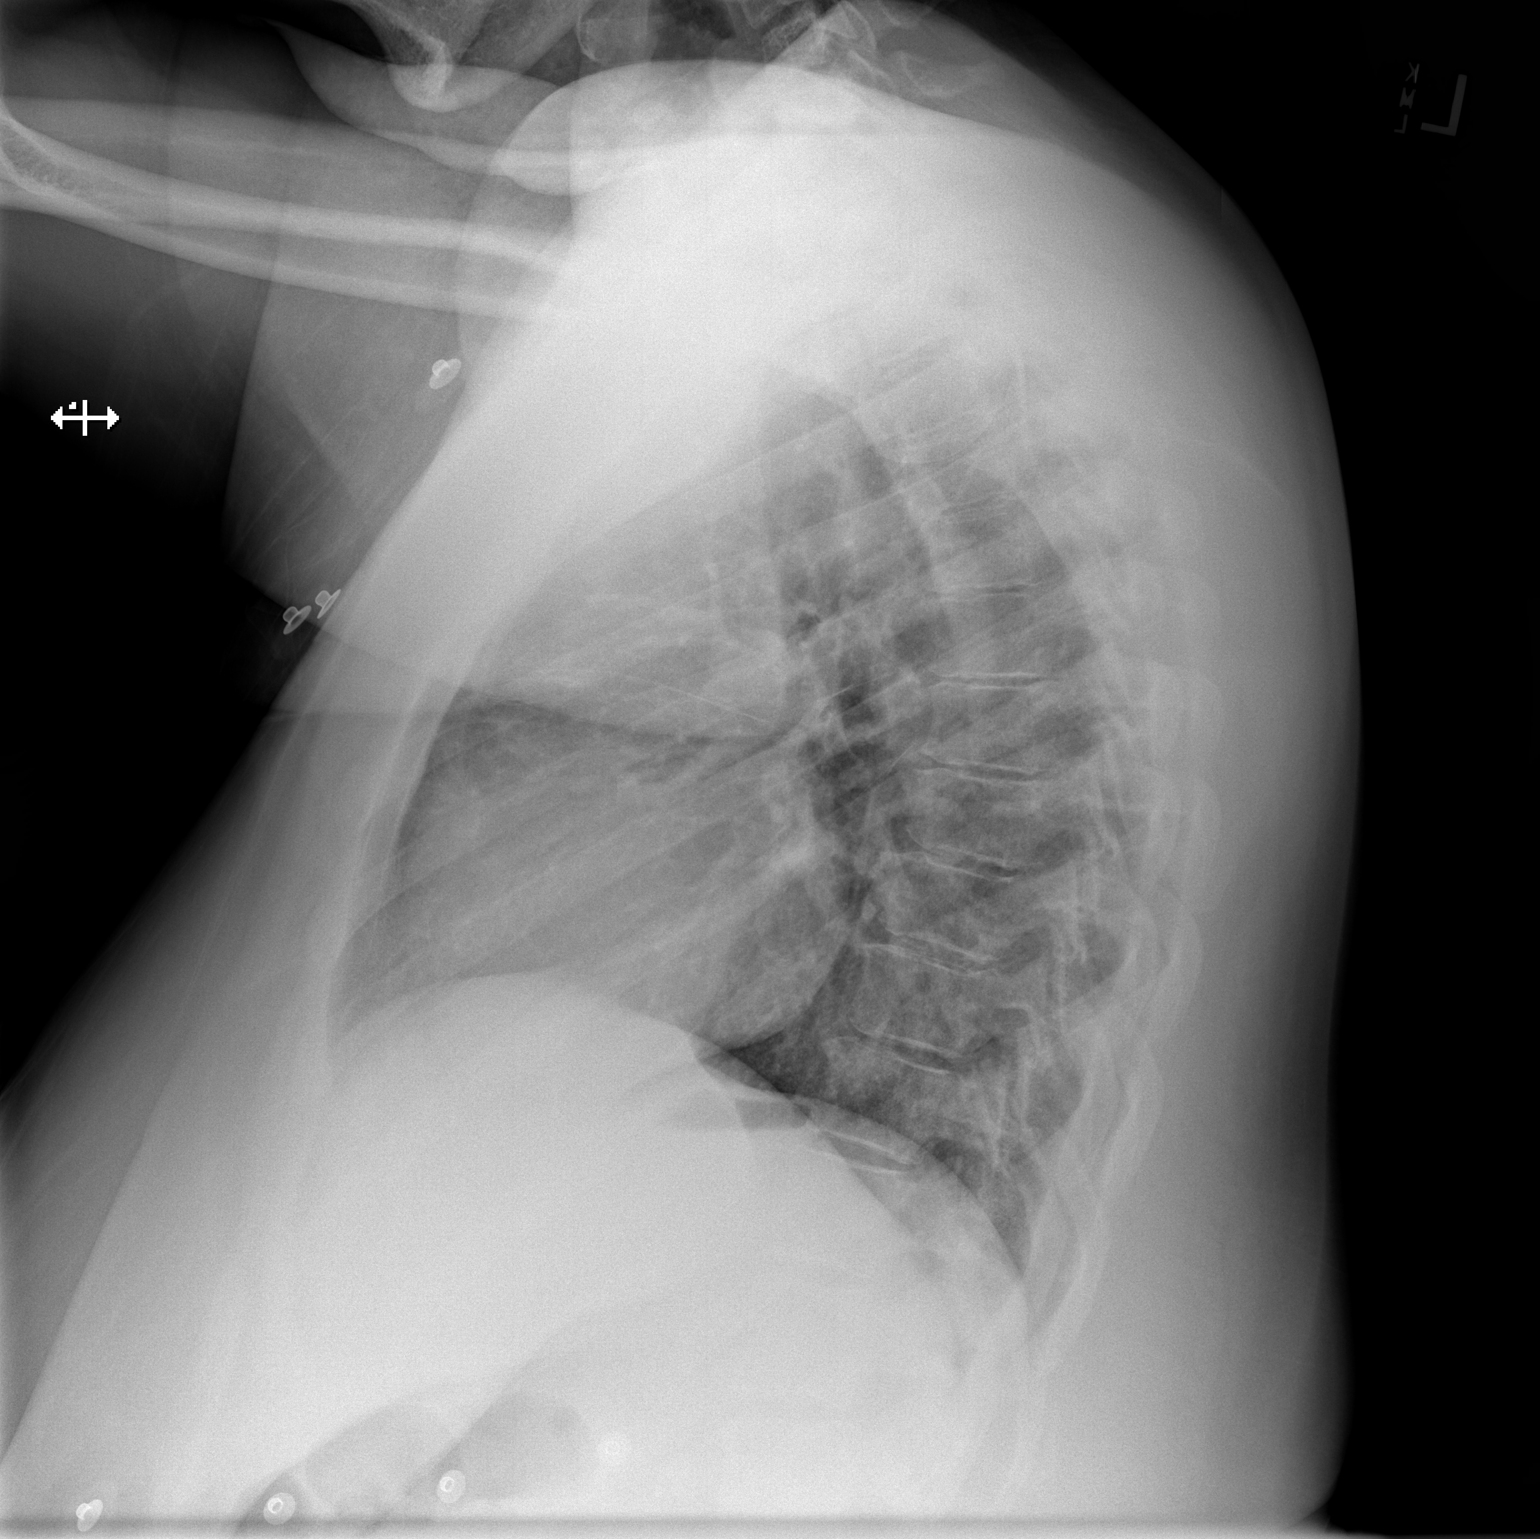

[2 of 2 positions shown; findings below may reference images not displayed]

FINDINGS: Heart size is normal. Mediastinal shadows are normal. The lungs are
clear. No bronchial thickening. No infiltrate, mass, effusion or
collapse. Pulmonary vascularity is normal. No bony abnormality.
IMPRESSION: Normal chest

## 2022-04-27 ENCOUNTER — Telehealth: Payer: Self-pay | Admitting: Hematology

## 2022-04-27 NOTE — Telephone Encounter (Signed)
Scheduled appt per 10/10 referral. Pt is aware of appt date and time. Pt is aware to arrive 15 mins prior to appt time and to bring and updated insurance card. Pt is aware of appt location.   

## 2022-05-17 ENCOUNTER — Inpatient Hospital Stay: Payer: No Typology Code available for payment source | Attending: Hematology | Admitting: Hematology

## 2022-05-17 ENCOUNTER — Other Ambulatory Visit: Payer: Self-pay

## 2022-05-17 ENCOUNTER — Inpatient Hospital Stay: Payer: No Typology Code available for payment source

## 2022-05-17 VITALS — BP 156/79 | HR 94 | Temp 98.1°F | Resp 17 | Ht 66.0 in | Wt 185.1 lb

## 2022-05-17 DIAGNOSIS — D500 Iron deficiency anemia secondary to blood loss (chronic): Secondary | ICD-10-CM | POA: Diagnosis not present

## 2022-05-17 DIAGNOSIS — G4730 Sleep apnea, unspecified: Secondary | ICD-10-CM | POA: Insufficient documentation

## 2022-05-17 DIAGNOSIS — E119 Type 2 diabetes mellitus without complications: Secondary | ICD-10-CM | POA: Insufficient documentation

## 2022-05-17 DIAGNOSIS — D509 Iron deficiency anemia, unspecified: Secondary | ICD-10-CM | POA: Insufficient documentation

## 2022-05-17 DIAGNOSIS — E785 Hyperlipidemia, unspecified: Secondary | ICD-10-CM | POA: Diagnosis not present

## 2022-05-17 DIAGNOSIS — I10 Essential (primary) hypertension: Secondary | ICD-10-CM | POA: Insufficient documentation

## 2022-05-17 DIAGNOSIS — Z7984 Long term (current) use of oral hypoglycemic drugs: Secondary | ICD-10-CM | POA: Diagnosis not present

## 2022-05-17 DIAGNOSIS — Z794 Long term (current) use of insulin: Secondary | ICD-10-CM | POA: Diagnosis not present

## 2022-05-17 DIAGNOSIS — Z79899 Other long term (current) drug therapy: Secondary | ICD-10-CM | POA: Insufficient documentation

## 2022-05-17 DIAGNOSIS — R5383 Other fatigue: Secondary | ICD-10-CM | POA: Diagnosis not present

## 2022-05-17 DIAGNOSIS — N920 Excessive and frequent menstruation with regular cycle: Secondary | ICD-10-CM | POA: Insufficient documentation

## 2022-05-17 LAB — CBC WITH DIFFERENTIAL (CANCER CENTER ONLY)
Abs Immature Granulocytes: 0.03 10*3/uL (ref 0.00–0.07)
Basophils Absolute: 0 10*3/uL (ref 0.0–0.1)
Basophils Relative: 0 %
Eosinophils Absolute: 0.1 10*3/uL (ref 0.0–0.5)
Eosinophils Relative: 1 %
HCT: 37.1 % (ref 36.0–46.0)
Hemoglobin: 12.2 g/dL (ref 12.0–15.0)
Immature Granulocytes: 0 %
Lymphocytes Relative: 23 %
Lymphs Abs: 2 10*3/uL (ref 0.7–4.0)
MCH: 26 pg (ref 26.0–34.0)
MCHC: 32.9 g/dL (ref 30.0–36.0)
MCV: 78.9 fL — ABNORMAL LOW (ref 80.0–100.0)
Monocytes Absolute: 0.6 10*3/uL (ref 0.1–1.0)
Monocytes Relative: 7 %
Neutro Abs: 6.2 10*3/uL (ref 1.7–7.7)
Neutrophils Relative %: 69 %
Platelet Count: 350 10*3/uL (ref 150–400)
RBC: 4.7 MIL/uL (ref 3.87–5.11)
RDW: 13.1 % (ref 11.5–15.5)
WBC Count: 9 10*3/uL (ref 4.0–10.5)
nRBC: 0 % (ref 0.0–0.2)

## 2022-05-17 LAB — CMP (CANCER CENTER ONLY)
ALT: 8 U/L (ref 0–44)
AST: 9 U/L — ABNORMAL LOW (ref 15–41)
Albumin: 3.7 g/dL (ref 3.5–5.0)
Alkaline Phosphatase: 68 U/L (ref 38–126)
Anion gap: 8 (ref 5–15)
BUN: 22 mg/dL — ABNORMAL HIGH (ref 6–20)
CO2: 24 mmol/L (ref 22–32)
Calcium: 8.9 mg/dL (ref 8.9–10.3)
Chloride: 103 mmol/L (ref 98–111)
Creatinine: 0.94 mg/dL (ref 0.44–1.00)
GFR, Estimated: >60 mL/min (ref >60)
Glucose, Bld: 292 mg/dL — ABNORMAL HIGH (ref 70–99)
Potassium: 3.9 mmol/L (ref 3.5–5.1)
Sodium: 135 mmol/L (ref 135–145)
Total Bilirubin: 0.2 mg/dL — ABNORMAL LOW (ref 0.3–1.2)
Total Protein: 7.4 g/dL (ref 6.5–8.1)

## 2022-05-17 LAB — TSH: TSH: 1.322 u[IU]/mL (ref 0.350–4.500)

## 2022-05-17 LAB — IRON AND IRON BINDING CAPACITY (CC-WL,HP ONLY)
Iron: 31 ug/dL (ref 28–170)
Saturation Ratios: 10 % — ABNORMAL LOW (ref 10.4–31.8)
TIBC: 311 ug/dL (ref 250–450)
UIBC: 280 ug/dL (ref 148–442)

## 2022-05-17 LAB — VITAMIN B12: Vitamin B-12: 311 pg/mL (ref 180–914)

## 2022-05-17 LAB — FERRITIN: Ferritin: 12 ng/mL (ref 11–307)

## 2022-05-17 LAB — VITAMIN D 25 HYDROXY (VIT D DEFICIENCY, FRACTURES): Vit D, 25-Hydroxy: 28.87 ng/mL — ABNORMAL LOW (ref 30–100)

## 2022-05-17 NOTE — Progress Notes (Signed)
Marland Kitchen   HEMATOLOGY/ONCOLOGY CONSULTATION NOTE  Date of Service: 05/17/2022  Patient Care Team: Clinic, [facility] as PCP - General  CHIEF COMPLAINTS/PURPOSE OF CONSULTATION:  Evaluation and management of Anemia and Iron deficiency  HISTORY OF PRESENTING ILLNESS:   Amber Jensen is a wonderful 41 y.o. female who has been referred to [place] by Dr .Skipper Cliche, [facility] for evaluation and management of Iron deficiency and Anemia.  Patient has a history of hypertension, dyslipidemia, diabetes type II, sleep apnea not currently compliant with CPAP machine possible diabetic neuropathy, degenerative disc disease, acid reflux on chronic PPI therapy. She was referred to [place] for management of iron deficiency and consideration of possible IV iron infusion. Patient notes that she has had significant progressive fatigue over the last 6 months to 1 year. She notes that she has not been using her CPAP machine and is feeling quite sleepy during the day.  She notes that she does not have the electric cord for her CPAP machine and was encouraged to work on getting this immediately. She also notes that she has been having fairly heavy menses and this has not yet been evaluated by her OB/GYN doctor.  These have gotten heavier in the last year. Patient notes that she has been taking oral iron for the last several months without any significant improvement in iron levels or energy. She has been on chronic PPI therapy for many years and we discussed that this could be a reason for poor iron absorption in addition to heavy menstrual losses.  Patient notes no overt change in bowel habits or signs of GI bleeding.  No black stools or blood in the stools. No unexpected weight loss. Patient is not on any blood thinners at this time. Denies any family history of known blood disorders.     MEDICAL HISTORY:  Past Medical History:  Diagnosis Date   Diabetes mellitus without complication (HCC)    Hyperlipidemia     Hypertension    Sleep apnea     SURGICAL HISTORY: Past Surgical History:  Procedure Laterality Date   INCISION AND DRAINAGE OF WOUND N/A 06/21/2019   Procedure: INCISION AND DRAINAGE ABSCESS groin/thigh;  Surgeon: Abigail Miyamoto, MD;  Location: [facility];  Service: General;  Laterality: N/A;   INCISION AND DRAINAGE PERIRECTAL ABSCESS Right 06/26/2019   Procedure: IRRIGATION AND DEBRIDEMENT right thigh  ABSCESS;  Surgeon: Griselda Miner, MD;  Location: [facility];  Service: General;  Laterality: Right;   SOCIAL HISTORY: Social History   Socioeconomic History   Marital status: Single    Spouse name: Not on file   Number of children: Not on file   Years of education: Not on file   Highest education level: Not on file  Occupational History   Not on file  Tobacco Use   Smoking status: Never   Smokeless tobacco: Never  Vaping Use   Vaping Use: Never used  Substance and Sexual Activity   Alcohol use: Not Currently   Drug use: Not Currently   Sexual activity: Not Currently  Other Topics Concern   Not on file  Social History Narrative   Not on file   Social Determinants of Health   Financial Resource Strain: Not on file  Food Insecurity: Not on file  Transportation Needs: Not on file  Physical Activity: Unknown (06/21/2019)   Exercise Vital Sign    Days of Exercise per Week: Patient refused    Minutes of Exercise per Session: Patient refused  Stress: No Stress Concern  Present (06/21/2019)   [place]    Feeling of Stress : Only a little  Social Connections: Not on file  Intimate Partner Violence: Not on file    FAMILY HISTORY: Family History  Problem Relation Age of Onset   Hypertension Father    Hypertension Paternal Aunt    Diabetes Paternal Aunt    Hypertension Paternal Uncle    Diabetes Paternal Uncle     ALLERGIES:  is allergic to gabapentin, percocet [oxycodone-acetaminophen], and vicodin  [hydrocodone-acetaminophen].  MEDICATIONS:  Current Outpatient Medications  Medication Sig Dispense Refill   acetaminophen (TYLENOL) 325 MG tablet Take 2 tablets (650 mg total) by mouth every 6 (six) hours as needed for mild pain or fever.     amLODipine (NORVASC) 10 MG tablet Take 1 tablet (10 mg total) by mouth daily. 30 tablet 0   Ascorbic Acid (VITAMIN C) 1000 MG tablet Take 1,000 mg by mouth 3 (three) times daily.     Biotin 1000 MCG tablet Take 1,000 mcg by mouth daily.     Chlorhexidine Gluconate Cloth 2 % PADS Apply 6 each topically daily at 6 (six) AM. 18 each 0   gabapentin (NEURONTIN) 100 MG capsule Take 100 mg by mouth 3 (three) times daily as needed (pain).     insulin detemir (LEVEMIR) 100 UNIT/ML injection Inject 25-45 Units into the skin See admin instructions. 25 units in the am and 45 units at bedtime     metFORMIN (GLUCOPHAGE-XR) 500 MG 24 hr tablet Take 500 mg by mouth daily with breakfast.     Multiple Vitamin (MULTIVITAMIN WITH MINERALS) TABS tablet Take 1 tablet by mouth daily.     ondansetron (ZOFRAN) 4 MG tablet Take 1 tablet (4 mg total) by mouth every 8 (eight) hours as needed for nausea or vomiting. 20 tablet 0   pantoprazole (PROTONIX) 40 MG tablet Take 1 tablet (40 mg total) by mouth daily. 14 tablet 0   pioglitazone (ACTOS) 45 MG tablet Take 45 mg by mouth daily.     Probiotic Product (PROBIOTIC DAILY) CAPS 1 capsule by mouth twice daily while taking antibiotics 14 capsule 0   traMADol (ULTRAM) 50 MG tablet Take 1-2 tablets (50-100 mg total) by mouth every 6 (six) hours as needed for moderate pain or severe pain. 30 tablet 0   No current facility-administered medications for this visit.    REVIEW OF SYSTEMS:    10 Point review of Systems was done is negative except as noted above.  PHYSICAL EXAMINATION: ECOG PERFORMANCE STATUS: 1 - Symptomatic but completely ambulatory  . Vitals:   05/17/22 1145  BP: (!) 156/79  Pulse: 94  Resp: 17  Temp: 98.1 F  (36.7 C)  SpO2: 100%   Filed Weights   05/17/22 1145  Weight: 185 lb 1.6 oz (84 kg)   .Body mass index is 29.88 kg/m.  GENERAL:alert, in no acute distress and comfortable SKIN: no acute rashes, no significant lesions EYES: conjunctiva are pink and non-injected, sclera anicteric OROPHARYNX: MMM, no exudates, no oropharyngeal erythema or ulceration NECK: supple, no JVD LYMPH:  no palpable lymphadenopathy in the cervical, axillary or inguinal regions LUNGS: clear to auscultation b/l with normal respiratory effort HEART: regular rate & rhythm ABDOMEN:  normoactive bowel sounds , non tender, not distended. Extremity: no pedal edema PSYCH: alert & oriented x 3 with fluent speech NEURO: no focal motor/sensory deficits  LABORATORY DATA:  I have reviewed the data as listed  .  Latest Ref Rng & Units 02/24/2021    7:51 PM 06/29/2019    5:32 AM 06/28/2019    5:50 AM  CBC  WBC 4.0 - 10.5 K/uL 9.6  10.2  14.3   Hemoglobin 12.0 - 15.0 g/dL 18.8  41.6  60.6   Hematocrit 36.0 - 46.0 % 41.1  32.8  33.2   Platelets 150 - 400 K/uL 353  367  475     .    Latest Ref Rng & Units 02/24/2021    7:50 PM 06/29/2019    5:32 AM 06/28/2019    5:50 AM  CMP  Glucose 70 - 99 mg/dL 301  601  61   BUN 6 - 20 mg/dL 22  10  12    Creatinine 0.44 - 1.00 mg/dL  0.93  2.35   Sodium 135 - 145 mmol/L 136  138  140   Potassium 3.5 - 5.1 mmol/L 3.8  3.2  3.0   Chloride 98 - 111 mmol/L 100  99  103   CO2 22 - 32 mmol/L 25  28  25    Calcium 8.9 - 10.3 mg/dL 9.3  8.4  8.3   Total Protein 6.5 - 8.1 g/dL 8.1     Total Bilirubin 0.3 - 1.2 mg/dL 0.4     Alkaline Phos 38 - 126 U/L 65     AST 15 - 41 U/L 13     ALT 0 - 44 U/L 12        RADIOGRAPHIC STUDIES: I have personally reviewed the radiological images as listed and agreed with the findings in the report. No results found.  ASSESSMENT & PLAN:   41 year old female with history of hypertension, diabetes, dyslipidemia with  #1 severe iron  deficiency with mild anemia Her iron deficiency has not been responsive to p.o. iron replacement even with ferrous sulfate 1 tablet p.o. twice daily. Iron deficiency appears to be likely related to her heavy menses and potentially decreased oral absorption of iron due to chronic PPI therapy.  #2 severe fatigue-likely multifactorial.  Significant iron deficiency could contribute to her fatigue but she also has multiple other factors including her other medical issues including diabetes with variable control and untreated sleep apnea.  Plan  -Patient's outside records were reviewed in detail and confirmed with her. -We discussed that her fatigue is multifactorial and that she should connect with her sleep medicine doctor to ensure that she has all the supplies she needs to use her CPAP machine with complete compliance when she sleeps at night and also with naps during the day. -In addition she could also consider discussing with her sleep medicine doctor if there is a role for stimulant during the day to avoid excessive sleepiness related to her sleeping disorder. -We discussed that her iron deficiency if significant could certainly be an additional reason for her fatigue and that her lack of response to p.o. iron we shall offer her the option of IV iron replacement if her iron levels have not improved. -We also discussed that she should consider trying to get off her PPIs and transition to H2 receptor blocker with her primary care physician if possible. -We recommended she discuss with her OB/GYN doctor her concerns with heavy menstrual losses for further evaluation and management to avoid ongoing significant menstrual losses that would also cause recurrent iron deficiency anemia. -Continue to optimize diet and exercise and minimize sedative medications. -From a fatigue standpoint we will also check her thyroid functions vitamin D and  vitamin B12 levels.  Follow-up Labs today Phone visit with Dr.  Candise Che in 1 week   The total time spent in the appointment was 60 minutes*.  All of the patient's questions were answered with apparent satisfaction. The patient knows to call the clinic with any problems, questions or concerns.   Wyvonnia Lora MD MS AAHIVMS [facility] Hematology/Oncology Physician [facility]  .*Total Encounter Time as defined by the Centers for Medicare and Medicaid Services includes, in addition to the face-to-face time of a patient visit (documented in the note above) non-face-to-face time: obtaining and reviewing outside history, ordering and reviewing medications, tests or procedures, care coordination (communications with other health care professionals or caregivers) and documentation in the medical record.    05/17/2022 11:58 AM

## 2022-05-24 ENCOUNTER — Inpatient Hospital Stay (HOSPITAL_BASED_OUTPATIENT_CLINIC_OR_DEPARTMENT_OTHER): Payer: No Typology Code available for payment source | Admitting: Hematology

## 2022-05-24 DIAGNOSIS — D500 Iron deficiency anemia secondary to blood loss (chronic): Secondary | ICD-10-CM | POA: Diagnosis not present

## 2022-05-24 DIAGNOSIS — D509 Iron deficiency anemia, unspecified: Secondary | ICD-10-CM | POA: Diagnosis not present

## 2022-05-24 NOTE — Progress Notes (Signed)
HEMATOLOGY ONCOLOGY PROGRESS NOTE  Date of service: 05/24/22   Patient Care Team: Clinic, [facility] as PCP - General  Diagnosis: Iron Deficiency Anemia   SUMMARY OF ONCOLOGIC HISTORY: Oncology History   No history exists.    INTERVAL HISTORY: .I connected with Amber Jensen on 11/08/23at  1:30 PM EST by telephone visit and verified that I am speaking with the correct person using two identifiers.   I discussed the limitations, risks, security and privacy concerns of performing an evaluation and management service by telemedicine and the availability of in-person appointments. I also discussed with the patient that there may be a patient responsible charge related to this service. The patient expressed understanding and agreed to proceed.   Other persons participating in the visit and their role in the encounter: None   Patient's location: Home  Provider's location: [facility]     Patient was last seen by me on 05/17/2022 and complained of fatigue.  She is doing well overall without any new concerns since over last visit. She reports that she is currently having heavy periods. She notes that she has upcoming OB/GYN appointment for her heavy period.   She notes she was previously taking B-12 supplements, but is not anymore. She is currently taking ferrous sulfate 2 times a day. She is not allergic to Tylenol.    REVIEW OF SYSTEMS:    10 Point review of systems of done and is negative except as noted above.  . Past Medical History:  Diagnosis Date   Diabetes mellitus without complication (HCC)    Hyperlipidemia    Hypertension    Sleep apnea     . Past Surgical History:  Procedure Laterality Date   INCISION AND DRAINAGE OF WOUND N/A 06/21/2019   Procedure: INCISION AND DRAINAGE ABSCESS groin/thigh;  Surgeon: Abigail Miyamoto, MD;  Location: [facility];  Service: General;  Laterality: N/A;   INCISION AND DRAINAGE PERIRECTAL ABSCESS Right 06/26/2019    Procedure: IRRIGATION AND DEBRIDEMENT right thigh  ABSCESS;  Surgeon: Griselda Miner, MD;  Location: [facility];  Service: General;  Laterality: Right;    . Social History   Tobacco Use   Smoking status: Never   Smokeless tobacco: Never  Vaping Use   Vaping Use: Never used  Substance Use Topics   Alcohol use: Not Currently   Drug use: Not Currently    ALLERGIES:  is allergic to gabapentin, percocet [oxycodone-acetaminophen], and vicodin [hydrocodone-acetaminophen].  MEDICATIONS:  Current Outpatient Medications  Medication Sig Dispense Refill   acetaminophen (TYLENOL) 325 MG tablet Take 2 tablets (650 mg total) by mouth every 6 (six) hours as needed for mild pain or fever.     amLODipine (NORVASC) 10 MG tablet Take 1 tablet (10 mg total) by mouth daily. 30 tablet 0   Ascorbic Acid (VITAMIN C) 1000 MG tablet Take 1,000 mg by mouth 3 (three) times daily.     Biotin 1000 MCG tablet Take 1,000 mcg by mouth daily.     Chlorhexidine Gluconate Cloth 2 % PADS Apply 6 each topically daily at 6 (six) AM. 18 each 0   gabapentin (NEURONTIN) 100 MG capsule Take 100 mg by mouth 3 (three) times daily as needed (pain).     insulin detemir (LEVEMIR) 100 UNIT/ML injection Inject 25-45 Units into the skin See admin instructions. 25 units in the am and 45 units at bedtime     metFORMIN (GLUCOPHAGE-XR) 500 MG 24 hr tablet Take 500 mg by mouth daily with breakfast.  Multiple Vitamin (MULTIVITAMIN WITH MINERALS) TABS tablet Take 1 tablet by mouth daily.     ondansetron (ZOFRAN) 4 MG tablet Take 1 tablet (4 mg total) by mouth every 8 (eight) hours as needed for nausea or vomiting. 20 tablet 0   pantoprazole (PROTONIX) 40 MG tablet Take 1 tablet (40 mg total) by mouth daily. 14 tablet 0   pioglitazone (ACTOS) 45 MG tablet Take 45 mg by mouth daily.     Probiotic Product (PROBIOTIC DAILY) CAPS 1 capsule by mouth twice daily while taking antibiotics 14 capsule 0   traMADol (ULTRAM) 50 MG tablet Take 1-2  tablets (50-100 mg total) by mouth every 6 (six) hours as needed for moderate pain or severe pain. 30 tablet 0   No current facility-administered medications for this visit.    PHYSICAL EXAMINATION: Telemedicine visit  LABORATORY DATA:   I have reviewed the data as listed  .    Latest Ref Rng & Units 05/17/2022   12:38 PM 02/24/2021    7:51 PM 06/29/2019    5:32 AM  CBC  WBC 4.0 - 10.5 K/uL 9.0  9.6  10.2   Hemoglobin 12.0 - 15.0 g/dL 76.1  60.7  37.1   Hematocrit 36.0 - 46.0 % 37.1  41.1  32.8   Platelets 150 - 400 K/uL 350  353  367        Latest Ref Rng & Units 05/17/2022   12:38 PM 02/24/2021    7:50 PM 06/29/2019    5:32 AM  CMP  Glucose 70 - 99 mg/dL 062  694  854   BUN 6 - 20 mg/dL 22  22  10    Creatinine 0.44 - 1.00 mg/dL  6.27  0.35   Sodium 135 - 145 mmol/L 135  136  138   Potassium 3.5 - 5.1 mmol/L 3.9  3.8  3.2   Chloride 98 - 111 mmol/L 103  100  99   CO2 22 - 32 mmol/L 24  25  28    Calcium 8.9 - 10.3 mg/dL 8.9  9.3  8.4   Total Protein 6.5 - 8.1 g/dL 7.4  8.1    Total Bilirubin 0.3 - 1.2 mg/dL 0.2  0.4    Alkaline Phos 38 - 126 U/L 68  65    AST 15 - 41 U/L 9  13    ALT 0 - 44 U/L 8  12     . Lab Results  Component Value Date   IRON 31 05/17/2022   TIBC 311 05/17/2022   IRONPCTSAT 10 (L) 05/17/2022   (Iron and TIBC)  Lab Results  Component Value Date   FERRITIN 12 05/17/2022   B12 -- 311  Vit D 25-OH -- 28.87  TSH -- 1.322  RADIOGRAPHIC STUDIES: I have personally reviewed the radiological images as listed and agreed with the findings in the report. No results found.  ASSESSMENT & PLAN:   41 year old female with history of hypertension, diabetes, dyslipidemia with   #1 severe iron deficiency with mild anemia Her iron deficiency has not been responsive to p.o. iron replacement even with ferrous sulfate 1 tablet p.o. twice daily. Iron deficiency appears to be likely related to her heavy menses and potentially decreased oral  absorption of iron due to chronic PPI therapy.   #2 severe fatigue-likely multifactorial.  Significant iron deficiency could contribute to her fatigue but she also has multiple other factors including her other medical issues including diabetes with variable control and untreated sleep apnea.  PLAN: -Discussed recent lab results from 05/17/2022. Labs showed WBC of 9.0 K, Hemoglobin of 12.2 K, and platelets of 350.  Labs showed ferritin level of 12. She is mildly anemic, iron deficient, and mildly vitamin B-12 deficient.  -Discussed IV Iron, IV Feraheme, and oral iron supplements. Patient is interested in starting IV Iron. -Recommended to start B-12 complex and vitamin-D supplements.   FOLLOW-UP: IV feraheme weekly x 2 doses ASAP RTC with Dr Candise Che with labs in 4 months  .The total time spent in the appointment was 20 minutes* .  All of the patient's questions were answered with apparent satisfaction. The patient knows to call the clinic with any problems, questions or concerns.   Wyvonnia Lora MD MS AAHIVMS [facility] Hematology/Oncology Physician [facility]  .*Total Encounter Time as defined by the Centers for Medicare and Medicaid Services includes, in addition to the face-to-face time of a patient visit (documented in the note above) non-face-to-face time: obtaining and reviewing outside history, ordering and reviewing medications, tests or procedures, care coordination (communications with other health care professionals or caregivers) and documentation in the medical record.  I, Ok Edwards, am acting as a Neurosurgeon for Wyvonnia Lora, MD.  .I have reviewed the above documentation for accuracy and completeness, and I agree with the above. Johney Maine MD

## 2022-05-25 ENCOUNTER — Encounter: Payer: Self-pay | Admitting: Hematology

## 2022-05-29 ENCOUNTER — Inpatient Hospital Stay: Payer: No Typology Code available for payment source

## 2022-05-29 ENCOUNTER — Other Ambulatory Visit: Payer: Self-pay

## 2022-05-29 VITALS — BP 143/89 | HR 85 | Temp 98.0°F | Resp 17

## 2022-05-29 DIAGNOSIS — D500 Iron deficiency anemia secondary to blood loss (chronic): Secondary | ICD-10-CM

## 2022-05-29 DIAGNOSIS — D509 Iron deficiency anemia, unspecified: Secondary | ICD-10-CM | POA: Diagnosis not present

## 2022-05-29 MED ORDER — SODIUM CHLORIDE 0.9 % IV SOLN: Freq: Once | INTRAVENOUS | Status: AC

## 2022-05-29 MED ORDER — LORATADINE 10 MG PO TABS
10.0000 mg | ORAL_TABLET | Freq: Once | ORAL | Status: AC
  Administered 2022-05-29: 10 mg via ORAL
  Filled 2022-05-29: qty 1

## 2022-05-29 MED ORDER — ACETAMINOPHEN 325 MG PO TABS
650.0000 mg | ORAL_TABLET | Freq: Once | ORAL | Status: AC
  Administered 2022-05-29: 650 mg via ORAL
  Filled 2022-05-29: qty 2

## 2022-05-29 MED ORDER — SODIUM CHLORIDE 0.9 % IV SOLN
510.0000 mg | Freq: Once | INTRAVENOUS | Status: AC
  Administered 2022-05-29: 510 mg via INTRAVENOUS
  Filled 2022-05-29: qty 510

## 2022-05-29 NOTE — Patient Instructions (Signed)

## 2022-05-30 ENCOUNTER — Encounter: Payer: Self-pay | Admitting: Hematology

## 2022-06-05 ENCOUNTER — Other Ambulatory Visit: Payer: Self-pay

## 2022-06-05 ENCOUNTER — Inpatient Hospital Stay: Payer: No Typology Code available for payment source

## 2022-06-05 VITALS — BP 155/93 | HR 88 | Temp 98.7°F | Resp 17

## 2022-06-05 DIAGNOSIS — D509 Iron deficiency anemia, unspecified: Secondary | ICD-10-CM | POA: Diagnosis not present

## 2022-06-05 DIAGNOSIS — D500 Iron deficiency anemia secondary to blood loss (chronic): Secondary | ICD-10-CM

## 2022-06-05 MED ORDER — SODIUM CHLORIDE 0.9 % IV SOLN
510.0000 mg | Freq: Once | INTRAVENOUS | Status: AC
  Administered 2022-06-05: 510 mg via INTRAVENOUS
  Filled 2022-06-05: qty 510

## 2022-06-05 MED ORDER — ACETAMINOPHEN 325 MG PO TABS
650.0000 mg | ORAL_TABLET | Freq: Once | ORAL | Status: AC
  Administered 2022-06-05: 650 mg via ORAL
  Filled 2022-06-05: qty 2

## 2022-06-05 MED ORDER — SODIUM CHLORIDE 0.9 % IV SOLN: Freq: Once | INTRAVENOUS | Status: AC

## 2022-06-05 MED ORDER — LORATADINE 10 MG PO TABS
10.0000 mg | ORAL_TABLET | Freq: Once | ORAL | Status: AC
  Administered 2022-06-05: 10 mg via ORAL
  Filled 2022-06-05: qty 1

## 2022-06-05 NOTE — Patient Instructions (Signed)

## 2022-09-22 ENCOUNTER — Other Ambulatory Visit: Payer: Self-pay

## 2022-09-22 DIAGNOSIS — D500 Iron deficiency anemia secondary to blood loss (chronic): Secondary | ICD-10-CM

## 2022-09-25 ENCOUNTER — Inpatient Hospital Stay: Payer: No Typology Code available for payment source | Admitting: Hematology

## 2022-09-25 ENCOUNTER — Inpatient Hospital Stay: Payer: No Typology Code available for payment source

## 2022-09-26 ENCOUNTER — Telehealth: Payer: Self-pay

## 2022-09-26 NOTE — Telephone Encounter (Signed)
Per 3/12 IB reached out to patient to reschedule missed appointment. Patient aware of date and time.

## 2022-09-27 ENCOUNTER — Other Ambulatory Visit: Payer: Self-pay

## 2022-09-27 ENCOUNTER — Emergency Department (HOSPITAL_BASED_OUTPATIENT_CLINIC_OR_DEPARTMENT_OTHER): Payer: No Typology Code available for payment source

## 2022-09-27 ENCOUNTER — Encounter (HOSPITAL_BASED_OUTPATIENT_CLINIC_OR_DEPARTMENT_OTHER): Payer: Self-pay | Admitting: Emergency Medicine

## 2022-09-27 ENCOUNTER — Emergency Department (HOSPITAL_BASED_OUTPATIENT_CLINIC_OR_DEPARTMENT_OTHER)
Admission: EM | Admit: 2022-09-27 | Discharge: 2022-09-27 | Disposition: A | Discharge status: Home / Self Care | Payer: No Typology Code available for payment source | Attending: Emergency Medicine | Admitting: Emergency Medicine

## 2022-09-27 DIAGNOSIS — Z794 Long term (current) use of insulin: Secondary | ICD-10-CM | POA: Diagnosis not present

## 2022-09-27 DIAGNOSIS — E119 Type 2 diabetes mellitus without complications: Secondary | ICD-10-CM | POA: Diagnosis not present

## 2022-09-27 DIAGNOSIS — Z79899 Other long term (current) drug therapy: Secondary | ICD-10-CM | POA: Insufficient documentation

## 2022-09-27 DIAGNOSIS — I10 Essential (primary) hypertension: Secondary | ICD-10-CM | POA: Insufficient documentation

## 2022-09-27 DIAGNOSIS — R109 Unspecified abdominal pain: Secondary | ICD-10-CM | POA: Diagnosis present

## 2022-09-27 DIAGNOSIS — K838 Other specified diseases of biliary tract: Secondary | ICD-10-CM | POA: Insufficient documentation

## 2022-09-27 DIAGNOSIS — E860 Dehydration: Secondary | ICD-10-CM | POA: Insufficient documentation

## 2022-09-27 DIAGNOSIS — Z7984 Long term (current) use of oral hypoglycemic drugs: Secondary | ICD-10-CM | POA: Diagnosis not present

## 2022-09-27 DIAGNOSIS — R1084 Generalized abdominal pain: Secondary | ICD-10-CM

## 2022-09-27 LAB — COMPREHENSIVE METABOLIC PANEL
ALT: 7 U/L (ref 0–44)
AST: 12 U/L — ABNORMAL LOW (ref 15–41)
Albumin: 4.2 g/dL (ref 3.5–5.0)
Alkaline Phosphatase: 54 U/L (ref 38–126)
Anion gap: 9 (ref 5–15)
BUN: 24 mg/dL — ABNORMAL HIGH (ref 6–20)
CO2: 25 mmol/L (ref 22–32)
Calcium: 9.4 mg/dL (ref 8.9–10.3)
Chloride: 103 mmol/L (ref 98–111)
Creatinine, Ser: 0.94 mg/dL (ref 0.44–1.00)
GFR, Estimated: >60 mL/min (ref >60)
Glucose, Bld: 157 mg/dL — ABNORMAL HIGH (ref 70–99)
Potassium: 3.9 mmol/L (ref 3.5–5.1)
Sodium: 137 mmol/L (ref 135–145)
Total Bilirubin: 0.6 mg/dL (ref 0.3–1.2)
Total Protein: 8 g/dL (ref 6.5–8.1)

## 2022-09-27 LAB — URINALYSIS, ROUTINE W REFLEX MICROSCOPIC
Bacteria, UA: NONE SEEN
Bilirubin Urine: NEGATIVE
Glucose, UA: >1000 mg/dL — AB
Ketones, ur: >80 mg/dL — AB
Nitrite: NEGATIVE
Protein, ur: 30 mg/dL — AB
Specific Gravity, Urine: >1.046 — ABNORMAL HIGH (ref 1.005–1.030)
pH: 5.5 (ref 5.0–8.0)

## 2022-09-27 LAB — CBC
HCT: 42.8 % (ref 36.0–46.0)
Hemoglobin: 13.9 g/dL (ref 12.0–15.0)
MCH: 26.3 pg (ref 26.0–34.0)
MCHC: 32.5 g/dL (ref 30.0–36.0)
MCV: 80.9 fL (ref 80.0–100.0)
Platelets: 404 10*3/uL — ABNORMAL HIGH (ref 150–400)
RBC: 5.29 MIL/uL — ABNORMAL HIGH (ref 3.87–5.11)
RDW: 12 % (ref 11.5–15.5)
WBC: 7.5 10*3/uL (ref 4.0–10.5)
nRBC: 0 % (ref 0.0–0.2)

## 2022-09-27 LAB — PREGNANCY, URINE: Preg Test, Ur: NEGATIVE

## 2022-09-27 LAB — LIPASE, BLOOD: Lipase: 56 U/L — ABNORMAL HIGH (ref 11–51)

## 2022-09-27 MED ORDER — ONDANSETRON HCL 4 MG PO TABS: 4.0000 mg | ORAL_TABLET | ORAL | 0 refills | Status: AC | PRN

## 2022-09-27 MED ORDER — SUCRALFATE 1 G PO TABS: 1.0000 g | ORAL_TABLET | Freq: Three times a day (TID) | ORAL | 0 refills | Status: AC

## 2022-09-27 MED ORDER — PANTOPRAZOLE SODIUM 40 MG PO TBEC: 40.0000 mg | DELAYED_RELEASE_TABLET | Freq: Every day | ORAL | 0 refills | Status: AC

## 2022-09-27 MED ORDER — FENTANYL CITRATE PF 50 MCG/ML IJ SOSY
50.0000 ug | PREFILLED_SYRINGE | Freq: Once | INTRAMUSCULAR | Status: AC
  Administered 2022-09-27: 50 ug via INTRAVENOUS
  Filled 2022-09-27: qty 1

## 2022-09-27 MED ORDER — ONDANSETRON HCL 4 MG/2ML IJ SOLN
4.0000 mg | Freq: Once | INTRAMUSCULAR | Status: AC
  Administered 2022-09-27: 4 mg via INTRAVENOUS
  Filled 2022-09-27: qty 2

## 2022-09-27 MED ORDER — SODIUM CHLORIDE 0.9 % IV BOLUS
1000.0000 mL | Freq: Once | INTRAVENOUS | Status: AC
  Administered 2022-09-27: 1000 mL via INTRAVENOUS

## 2022-09-27 NOTE — ED Provider Notes (Signed)
[facility] Provider Note   CSN: [id number] Arrival date & time: 09/27/22  1310     History  Chief Complaint  Patient presents with   Flank Pain    Brittnei Baris is a 42 y.o. female.   Flank Pain  Patient presents with left flank pain.  Has had on and off since Sunday with today being Wednesday.  At times feel as if she is getting stabbed.  No blood in the urine.  But states she may be urinating less.  Seen at [facility] urgent care and reportedly they were not able to do a urinalysis.  States pain is gotten worse since then.  No fevers.  Denies pregnancy.  Has not had pains like this before.  Does have chronic back pain but states it feels different.    Past Medical History:  Diagnosis Date   Diabetes mellitus without complication ([place])    Hyperlipidemia    Hypertension    Sleep apnea      Home Medications Prior to Admission medications   Medication Sig Start Date End Date Taking? Authorizing Provider  acetaminophen (TYLENOL) 325 MG tablet Take 2 tablets (650 mg total) by mouth every 6 (six) hours as needed for mild pain or fever. 06/27/19   Saverio Danker, PA-C  amLODipine (NORVASC) 10 MG tablet Take 1 tablet (10 mg total) by mouth daily. 06/30/19   Blain Pais, MD  Ascorbic Acid (VITAMIN C) 1000 MG tablet Take 1,000 mg by mouth 3 (three) times daily.    [provider]  Biotin 1000 MCG tablet Take 1,000 mcg by mouth daily.    [provider]  Chlorhexidine Gluconate Cloth 2 % PADS Apply 6 each topically daily at 6 (six) AM. 06/30/19   Blain Pais, MD  gabapentin (NEURONTIN) 100 MG capsule Take 100 mg by mouth 3 (three) times daily as needed (pain).    [provider]  insulin detemir (LEVEMIR) 100 UNIT/ML injection Inject 25-45 Units into the skin See admin instructions. 25 units in the am and 45 units at bedtime    [provider]  metFORMIN (GLUCOPHAGE-XR) 500 MG 24 hr tablet Take  500 mg by mouth daily with breakfast.    [provider]  Multiple Vitamin (MULTIVITAMIN WITH MINERALS) TABS tablet Take 1 tablet by mouth daily.    [provider]  ondansetron (ZOFRAN) 4 MG tablet Take 1 tablet (4 mg total) by mouth every 8 (eight) hours as needed for nausea or vomiting. 06/29/19   Blain Pais, MD  pantoprazole (PROTONIX) 40 MG tablet Take 1 tablet (40 mg total) by mouth daily. 02/25/21   Quintella Reichert, MD  pioglitazone (ACTOS) 45 MG tablet Take 45 mg by mouth daily.    [provider]  Probiotic Product (PROBIOTIC DAILY) CAPS 1 capsule by mouth twice daily while taking antibiotics 06/29/19   Blain Pais, MD  traMADol (ULTRAM) 50 MG tablet Take 1-2 tablets (50-100 mg total) by mouth every 6 (six) hours as needed for moderate pain or severe pain. 06/27/19   Saverio Danker, PA-C      Allergies    Gabapentin, Percocet [oxycodone-acetaminophen], and Vicodin [hydrocodone-acetaminophen]    Review of Systems   Review of Systems  Genitourinary:  Positive for flank pain.    Physical Exam Updated Vital Signs BP (!) 164/96   Pulse 70   Temp 98.3 F (36.8 C) (Oral)   Resp 20   Ht '5\' 6"'$  (1.676 m)  Wt 119.3 kg   LMP 09/05/2022 Comment: irregular  SpO2 98%   BMI 42.45 kg/m  Physical Exam Vitals and nursing note reviewed.  Cardiovascular:     Rate and Rhythm: Regular rhythm.  Pulmonary:     Breath sounds: No rales.  Abdominal:     Tenderness: There is abdominal tenderness.     Comments: Left upper quadrant tenderness without rebound or guarding.  No hernia palpated.  Genitourinary:    Comments: Left-sided CVA tenderness. Musculoskeletal:     Cervical back: Neck supple.  Skin:    General: Skin is warm.  Neurological:     Mental Status: She is alert and oriented to person, place, and time.     ED Results / Procedures / Treatments   Labs (all labs ordered are listed, but only abnormal results are displayed) Labs  Reviewed  URINALYSIS, ROUTINE W REFLEX MICROSCOPIC - Abnormal; Notable for the following components:      Result Value   Specific Gravity, Urine >1.046 (*)    Glucose, UA >1,000 (*)    Hgb urine dipstick LARGE (*)    Ketones, ur >80 (*)    Protein, ur 30 (*)    Leukocytes,Ua TRACE (*)    All other components within normal limits  CBC - Abnormal; Notable for the following components:   RBC 5.29 (*)    Platelets 404 (*)    All other components within normal limits  COMPREHENSIVE METABOLIC PANEL - Abnormal; Notable for the following components:   Glucose, Bld 157 (*)    BUN 24 (*)    AST 12 (*)    All other components within normal limits  LIPASE, BLOOD - Abnormal; Notable for the following components:   Lipase 56 (*)    All other components within normal limits  PREGNANCY, URINE    EKG None  Radiology No results found.  Procedures Procedures    Medications Ordered in ED Medications  fentaNYL (SUBLIMAZE) injection 50 mcg (50 mcg Intravenous Given 09/27/22 1348)  ondansetron (ZOFRAN) injection 4 mg (4 mg Intravenous Given 09/27/22 1349)    ED Course/ Medical Decision Making/ A&P Clinical Course as of 09/27/22 1515  Wed Sep 27, 2022  1514 42 yo female, flank pain, mild elev to lipase and some blood in urine. Pending CT renal.  [SG]    Clinical Course User Index [SG] Jeanell Sparrow, DO                             Medical Decision Making Amount and/or Complexity of Data Reviewed Labs: ordered. Radiology: ordered.  Risk Prescription drug management.   Patient with left flank pain.  Has had for the last few days.  Differential diagnosis includes stone, infection, pancreatitis.  With abdominal tenderness will get blood work including lipase.  Will also get urinalysis.  Will need CT scanning, either with or without contrast depending on the blood work.  Reviewed note from urgent care.  Will treat with fentanyl now and potentially Toradol depending on kidney function and  findings.   Patient feeling somewhat better, however lipase only mildly elevated.  Will get CT scan.  Care turned over to Dr. Pearline Cables.        Final Clinical Impression(s) / ED Diagnoses Final diagnoses:  None    Rx / DC Orders ED Discharge Orders     None         Davonna Belling, MD 09/27/22 1515

## 2022-09-27 NOTE — ED Notes (Signed)
Patient given diet gingerale  to attempt fluid challenge

## 2022-09-27 NOTE — Discharge Instructions (Addendum)
It was a pleasure caring for you today in the emergency department. ° °Please return to the emergency department for any worsening or worrisome symptoms. ° ° °

## 2022-09-27 NOTE — ED Notes (Signed)
Tolerated fluids and crackers

## 2022-09-27 NOTE — ED Provider Notes (Addendum)
  Provider Note MRN:  [id number]  Arrival date & time: 09/27/22    ED Course and Medical Decision Making  Assumed care from [place] at shift change.  See note from prior team for complete details, in brief:   Clinical Course as of 09/27/22 1906  Wed Sep 27, 2022  1514 42 yo female, flank pain, mild elev to lipase and some blood in urine. Pending CT renal.  [SG]  0973 CT with biliary sludge, ?cellulitis to pannus. Stable  [SG]    Clinical Course User Index [SG] Jeanell Sparrow, DO   Labs reviewed, ketonuria and elevated BUN.  Concern for mild dehydration.  Given IV fluids.  Patient feeling better after intervention, she is able tolerate p.o. intake without difficulty.  CT imaging reviewed. Biliary sludge but no RUQ pain.   She is feeling much better, tolerating PO, stable for discharge  The patient's overall condition has improved, the patient presents with abdominal pain without signs of peritonitis, or other life-threatening serious etiology. The patient understands that at this time there is no evidence for a more malignant underlying process, but the patient also understands that early in the process of an illness, an emergency department workup can be falsely reassuring. Detailed discussions were had with the patient regarding current findings, and need for close f/u with PCP or on call doctor. The patient appears stable for discharge and has been instructed to return immediately if the symptoms worsen in any way, or if not improved for re-evaluation. Patient verbalized understanding and is in agreement with current care plan.  All questions answered prior to discharge.   Procedures  Final Clinical Impressions(s) / ED Diagnoses     ICD-10-CM   1. Generalized abdominal pain  R10.84     2. Biliary sludge  K83.8     3. Mild dehydration  E86.0       ED Discharge Orders          Ordered    pantoprazole (PROTONIX) 40 MG tablet  Daily        09/27/22 1902    ondansetron (ZOFRAN)  4 MG tablet  Every 4 hours PRN        09/27/22 1902    sucralfate (CARAFATE) 1 g tablet  3 times daily with meals        09/27/22 1902              Discharge Instructions      It was a pleasure caring for you today in the emergency department.  Please return to the emergency department for any worsening or worrisome symptoms.         Jeanell Sparrow, DO 09/27/22 [place], [place], DO 09/27/22 1907

## 2022-09-27 NOTE — ED Triage Notes (Signed)
Pt via pov from home with left flank pain since Sunday. Pt reports that it worsened today. Pt unable to tell if there is any blood in urine; states she is urinating less than usual, but denies pain upon urination. Pt alert & oriented, nad noted.

## 2022-09-27 NOTE — ED Notes (Signed)
Rn reviewed discharge instructions with pt. Pt verbalized understanding and had no further questions. VSS upon discharge. °

## 2022-11-08 ENCOUNTER — Inpatient Hospital Stay: Payer: No Typology Code available for payment source | Attending: Hematology

## 2022-11-08 ENCOUNTER — Other Ambulatory Visit: Payer: Self-pay

## 2022-11-08 ENCOUNTER — Inpatient Hospital Stay (HOSPITAL_BASED_OUTPATIENT_CLINIC_OR_DEPARTMENT_OTHER): Payer: No Typology Code available for payment source | Admitting: Hematology

## 2022-11-08 VITALS — BP 150/87 | HR 83 | Temp 98.3°F | Resp 20 | Wt 267.1 lb

## 2022-11-08 DIAGNOSIS — G4730 Sleep apnea, unspecified: Secondary | ICD-10-CM | POA: Insufficient documentation

## 2022-11-08 DIAGNOSIS — Z794 Long term (current) use of insulin: Secondary | ICD-10-CM | POA: Insufficient documentation

## 2022-11-08 DIAGNOSIS — D500 Iron deficiency anemia secondary to blood loss (chronic): Secondary | ICD-10-CM

## 2022-11-08 DIAGNOSIS — D509 Iron deficiency anemia, unspecified: Secondary | ICD-10-CM | POA: Insufficient documentation

## 2022-11-08 DIAGNOSIS — Z7984 Long term (current) use of oral hypoglycemic drugs: Secondary | ICD-10-CM | POA: Diagnosis not present

## 2022-11-08 DIAGNOSIS — E785 Hyperlipidemia, unspecified: Secondary | ICD-10-CM | POA: Diagnosis not present

## 2022-11-08 DIAGNOSIS — I10 Essential (primary) hypertension: Secondary | ICD-10-CM | POA: Diagnosis not present

## 2022-11-08 DIAGNOSIS — Z79899 Other long term (current) drug therapy: Secondary | ICD-10-CM | POA: Insufficient documentation

## 2022-11-08 DIAGNOSIS — E119 Type 2 diabetes mellitus without complications: Secondary | ICD-10-CM | POA: Diagnosis not present

## 2022-11-08 LAB — CMP (CANCER CENTER ONLY)
ALT: 8 U/L (ref 0–44)
AST: 11 U/L — ABNORMAL LOW (ref 15–41)
Albumin: 4 g/dL (ref 3.5–5.0)
Alkaline Phosphatase: 60 U/L (ref 38–126)
Anion gap: 5 (ref 5–15)
BUN: 15 mg/dL (ref 6–20)
CO2: 27 mmol/L (ref 22–32)
Calcium: 9.1 mg/dL (ref 8.9–10.3)
Chloride: 104 mmol/L (ref 98–111)
Creatinine: 0.89 mg/dL (ref 0.44–1.00)
GFR, Estimated: >60 mL/min (ref >60)
Glucose, Bld: 244 mg/dL — ABNORMAL HIGH (ref 70–99)
Potassium: 4.1 mmol/L (ref 3.5–5.1)
Sodium: 136 mmol/L (ref 135–145)
Total Bilirubin: 0.4 mg/dL (ref 0.3–1.2)
Total Protein: 7.4 g/dL (ref 6.5–8.1)

## 2022-11-08 LAB — IRON AND IRON BINDING CAPACITY (CC-WL,HP ONLY)
Iron: 61 ug/dL (ref 28–170)
Saturation Ratios: 25 % (ref 10.4–31.8)
TIBC: 242 ug/dL — ABNORMAL LOW (ref 250–450)
UIBC: 181 ug/dL (ref 148–442)

## 2022-11-08 LAB — CBC WITH DIFFERENTIAL (CANCER CENTER ONLY)
Abs Immature Granulocytes: 0.02 10*3/uL (ref 0.00–0.07)
Basophils Absolute: 0.1 10*3/uL (ref 0.0–0.1)
Basophils Relative: 1 %
Eosinophils Absolute: 0.1 10*3/uL (ref 0.0–0.5)
Eosinophils Relative: 2 %
HCT: 41.9 % (ref 36.0–46.0)
Hemoglobin: 13.6 g/dL (ref 12.0–15.0)
Immature Granulocytes: 0 %
Lymphocytes Relative: 32 %
Lymphs Abs: 2.2 10*3/uL (ref 0.7–4.0)
MCH: 26.6 pg (ref 26.0–34.0)
MCHC: 32.5 g/dL (ref 30.0–36.0)
MCV: 82 fL (ref 80.0–100.0)
Monocytes Absolute: 0.5 10*3/uL (ref 0.1–1.0)
Monocytes Relative: 7 %
Neutro Abs: 4 10*3/uL (ref 1.7–7.7)
Neutrophils Relative %: 58 %
Platelet Count: 362 10*3/uL (ref 150–400)
RBC: 5.11 MIL/uL (ref 3.87–5.11)
RDW: 12.3 % (ref 11.5–15.5)
WBC Count: 6.8 10*3/uL (ref 4.0–10.5)
nRBC: 0 % (ref 0.0–0.2)

## 2022-11-08 LAB — FERRITIN: Ferritin: 96 ng/mL (ref 11–307)

## 2022-11-08 LAB — VITAMIN B12: Vitamin B-12: 400 pg/mL (ref 180–914)

## 2022-11-08 NOTE — Progress Notes (Signed)
HEMATOLOGY ONCOLOGY PROGRESS NOTE  Date of service: 11/08/22   Patient Care Team: Clinic, [facility] as PCP - General  Diagnosis: Iron Deficiency Anemia   SUMMARY OF ONCOLOGIC HISTORY: Oncology History   No history exists.    INTERVAL HISTORY:  Amber Jensen is a 42 y.o. female here for continued evaluation and management of iron deficiency anemia. Patient was last seen by me on 05/24/2022 and complained of heavy menstrual cycles, but was otherwise doing well overall with no new medical concerns.   Today, she reports that she has tolerated IV iron well with no toxicities. She reports improved energy levels and does walk regularly. She has been taking a natural iron supplementdaily but has not for the last two weeks.  Patient has received a birth control implant to improve menstrual losses. She currently experiences much lighter menstrual cycles. She reports that she has discontinued her acid suppressant medication.   REVIEW OF SYSTEMS:    10 Point review of Systems was done is negative except as noted above.   . Past Medical History:  Diagnosis Date   Diabetes mellitus without complication (HCC)    Hyperlipidemia    Hypertension    Sleep apnea     . Past Surgical History:  Procedure Laterality Date   INCISION AND DRAINAGE OF WOUND N/A 06/21/2019   Procedure: INCISION AND DRAINAGE ABSCESS groin/thigh;  Surgeon: Abigail Miyamoto, MD;  Location: [facility];  Service: General;  Laterality: N/A;   INCISION AND DRAINAGE PERIRECTAL ABSCESS Right 06/26/2019   Procedure: IRRIGATION AND DEBRIDEMENT right thigh  ABSCESS;  Surgeon: Griselda Miner, MD;  Location: [facility];  Service: General;  Laterality: Right;    . Social History   Tobacco Use   Smoking status: Never   Smokeless tobacco: Never  Vaping Use   Vaping Use: Never used  Substance Use Topics   Alcohol use: Not Currently   Drug use: Not Currently    ALLERGIES:  is allergic to gabapentin, percocet  [oxycodone-acetaminophen], and vicodin [hydrocodone-acetaminophen].  MEDICATIONS:  Current Outpatient Medications  Medication Sig Dispense Refill   acetaminophen (TYLENOL) 325 MG tablet Take 2 tablets (650 mg total) by mouth every 6 (six) hours as needed for mild pain or fever.     amLODipine (NORVASC) 10 MG tablet Take 1 tablet (10 mg total) by mouth daily. 30 tablet 0   Ascorbic Acid (VITAMIN C) 1000 MG tablet Take 1,000 mg by mouth 3 (three) times daily.     Biotin 1000 MCG tablet Take 1,000 mcg by mouth daily.     Chlorhexidine Gluconate Cloth 2 % PADS Apply 6 each topically daily at 6 (six) AM. 18 each 0   gabapentin (NEURONTIN) 100 MG capsule Take 100 mg by mouth 3 (three) times daily as needed (pain).     insulin detemir (LEVEMIR) 100 UNIT/ML injection Inject 25-45 Units into the skin See admin instructions. 25 units in the am and 45 units at bedtime     metFORMIN (GLUCOPHAGE-XR) 500 MG 24 hr tablet Take 500 mg by mouth daily with breakfast.     Multiple Vitamin (MULTIVITAMIN WITH MINERALS) TABS tablet Take 1 tablet by mouth daily.     ondansetron (ZOFRAN) 4 MG tablet Take 1 tablet (4 mg total) by mouth every 8 (eight) hours as needed for nausea or vomiting. 20 tablet 0   ondansetron (ZOFRAN) 4 MG tablet Take 1 tablet (4 mg total) by mouth every 4 (four) hours as needed for nausea or vomiting. 12 tablet 0  pantoprazole (PROTONIX) 40 MG tablet Take 1 tablet (40 mg total) by mouth daily. 14 tablet 0   pantoprazole (PROTONIX) 40 MG tablet Take 1 tablet (40 mg total) by mouth daily for 14 days. 14 tablet 0   pioglitazone (ACTOS) 45 MG tablet Take 45 mg by mouth daily.     Probiotic Product (PROBIOTIC DAILY) CAPS 1 capsule by mouth twice daily while taking antibiotics 14 capsule 0   sucralfate (CARAFATE) 1 g tablet Take 1 tablet (1 g total) by mouth with breakfast, with lunch, and with evening meal for 7 days. 21 tablet 0   traMADol (ULTRAM) 50 MG tablet Take 1-2 tablets (50-100 mg total) by  mouth every 6 (six) hours as needed for moderate pain or severe pain. 30 tablet 0   No current facility-administered medications for this visit.    PHYSICAL EXAMINATION:  .BP (!) 150/87   Pulse 83   Temp 98.3 F (36.8 C)   Resp 20   Wt 267 lb 1.6 oz (121.2 kg)   SpO2 98%   BMI 43.11 kg/m   GENERAL:alert, in no acute distress and comfortable SKIN: no acute rashes, no significant lesions EYES: conjunctiva are pink and non-injected, sclera anicteric OROPHARYNX: MMM, no exudates, no oropharyngeal erythema or ulceration NECK: supple, no JVD LYMPH:  no palpable lymphadenopathy in the cervical, axillary or inguinal regions LUNGS: clear to auscultation b/l with normal respiratory effort HEART: regular rate & rhythm ABDOMEN:  normoactive bowel sounds , non tender, not distended. Extremity: no pedal edema PSYCH: alert & oriented x 3 with fluent speech NEURO: no focal motor/sensory deficits   LABORATORY DATA:   I have reviewed the data as listed  .    Latest Ref Rng & Units 11/08/2022    8:55 AM 09/27/2022    1:55 PM 05/17/2022   12:38 PM  CBC  WBC 4.0 - 10.5 K/uL 6.8  7.5  9.0   Hemoglobin 12.0 - 15.0 g/dL 16.1  09.6  04.5   Hematocrit 36.0 - 46.0 % 41.9  42.8  37.1   Platelets 150 - 400 K/uL 362  404  350        Latest Ref Rng & Units 11/08/2022    8:55 AM 09/27/2022    1:55 PM 05/17/2022   12:38 PM  CMP  Glucose 70 - 99 mg/dL 409  811  914   BUN 6 - 20 mg/dL 15  24  22    Creatinine 0.44 - 1.00 mg/dL 7.82  9.56  2.13   Sodium 135 - 145 mmol/L 136  137  135   Potassium 3.5 - 5.1 mmol/L 4.1  3.9  3.9   Chloride 98 - 111 mmol/L 104  103  103   CO2 22 - 32 mmol/L 27  25  24    Calcium 8.9 - 10.3 mg/dL 9.1  9.4  8.9   Total Protein 6.5 - 8.1 g/dL 7.4  8.0  7.4   Total Bilirubin 0.3 - 1.2 mg/dL 0.4  0.6  0.2   Alkaline Phos 38 - 126 U/L 60  54  68   AST 15 - 41 U/L 11  12  9    ALT 0 - 44 U/L 8  7  8     . Lab Results  Component Value Date   IRON 61 11/08/2022   TIBC  242 (L) 11/08/2022   IRONPCTSAT 25 11/08/2022   (Iron and TIBC)  Lab Results  Component Value Date   FERRITIN 96 11/08/2022   B12 --  311  Vit D 25-OH -- 28.87  TSH -- 1.322  RADIOGRAPHIC STUDIES: I have personally reviewed the radiological images as listed and agreed with the findings in the report. No results found.  ASSESSMENT & PLAN:   42 year old female with history of hypertension, diabetes, dyslipidemia with   #1 severe iron deficiency with mild anemia Her iron deficiency has not been responsive to p.o. iron replacement even with ferrous sulfate 1 tablet p.o. twice daily. Iron deficiency appears to be likely related to her heavy menses and potentially decreased oral absorption of iron due to chronic PPI therapy.   #2 severe fatigue-likely multifactorial.  Significant iron deficiency could contribute to her fatigue but she also has multiple other factors including her other medical issues including diabetes with variable control and untreated sleep apnea.  PLAN:  -Discussed lab results on 11/08/2022 in detail with patient. CBC normal, showed WBC of 6.8K, hemoglobin of 13.6, and platelets of 362K. -no anemia -CMP normal -ferritin 96 with iron saturation of 25%. -no indication for additional IV iron at this time. -continue po iron replacement per recommendation and monitor this with PCP -B12 labs 300. -Continue B-12 complex and vitamin-D supplements.  -discussed causes of iron deficiency such as acid reflux -reccommended patient to consume a balanced diet with iron-rich foods -Patient regularly follows up with PCP every 4 months  FOLLOW-UP: RTC with PCP  The total time spent in the appointment was 20 minutes* .  All of the patient's questions were answered with apparent satisfaction. The patient knows to call the clinic with any problems, questions or concerns.   Wyvonnia Lora MD MS AAHIVMS [facility] Hematology/Oncology Physician [facility]  .*Total  Encounter Time as defined by the Centers for Medicare and Medicaid Services includes, in addition to the face-to-face time of a patient visit (documented in the note above) non-face-to-face time: obtaining and reviewing outside history, ordering and reviewing medications, tests or procedures, care coordination (communications with other health care professionals or caregivers) and documentation in the medical record.    I,Mitra Faeizi,acting as a Neurosurgeon for Wyvonnia Lora, MD.,have documented all relevant documentation on the behalf of Wyvonnia Lora, MD,as directed by  Wyvonnia Lora, MD while in the presence of Wyvonnia Lora, MD.  .I have reviewed the above documentation for accuracy and completeness, and I agree with the above. Johney Maine MD

## 2022-11-14 ENCOUNTER — Encounter: Payer: Self-pay | Admitting: Hematology

## 2022-11-16 ENCOUNTER — Encounter: Payer: Self-pay | Admitting: Hematology

## 2024-01-30 ENCOUNTER — Encounter: Payer: Self-pay | Admitting: Hematology

## 2024-03-19 ENCOUNTER — Encounter: Payer: Self-pay | Admitting: Hematology

## 2024-03-19 ENCOUNTER — Other Ambulatory Visit: Payer: Self-pay

## 2024-03-19 ENCOUNTER — Encounter (HOSPITAL_BASED_OUTPATIENT_CLINIC_OR_DEPARTMENT_OTHER): Payer: Self-pay | Admitting: Emergency Medicine

## 2024-03-19 DIAGNOSIS — Z794 Long term (current) use of insulin: Secondary | ICD-10-CM | POA: Insufficient documentation

## 2024-03-19 DIAGNOSIS — E119 Type 2 diabetes mellitus without complications: Secondary | ICD-10-CM | POA: Insufficient documentation

## 2024-03-19 DIAGNOSIS — Z79899 Other long term (current) drug therapy: Secondary | ICD-10-CM | POA: Insufficient documentation

## 2024-03-19 DIAGNOSIS — X509XXA Other and unspecified overexertion or strenuous movements or postures, initial encounter: Secondary | ICD-10-CM | POA: Insufficient documentation

## 2024-03-19 DIAGNOSIS — Z7984 Long term (current) use of oral hypoglycemic drugs: Secondary | ICD-10-CM | POA: Diagnosis not present

## 2024-03-19 DIAGNOSIS — I10 Essential (primary) hypertension: Secondary | ICD-10-CM | POA: Diagnosis not present

## 2024-03-19 DIAGNOSIS — S39012A Strain of muscle, fascia and tendon of lower back, initial encounter: Secondary | ICD-10-CM | POA: Insufficient documentation

## 2024-03-19 DIAGNOSIS — M545 Low back pain, unspecified: Secondary | ICD-10-CM | POA: Diagnosis present

## 2024-03-19 LAB — URINALYSIS, ROUTINE W REFLEX MICROSCOPIC
Bilirubin Urine: NEGATIVE
Glucose, UA: >1000 mg/dL — AB
Ketones, ur: NEGATIVE mg/dL
Nitrite: NEGATIVE
Protein, ur: NEGATIVE mg/dL
RBC / HPF: >50 RBC/hpf (ref 0–5)
Specific Gravity, Urine: 1.044 — ABNORMAL HIGH (ref 1.005–1.030)
pH: 5 (ref 5.0–8.0)

## 2024-03-19 LAB — CBC WITH DIFFERENTIAL/PLATELET
Abs Immature Granulocytes: 0.02 K/uL (ref 0.00–0.07)
Basophils Absolute: 0 K/uL (ref 0.0–0.1)
Basophils Relative: 1 %
Eosinophils Absolute: 0.2 K/uL (ref 0.0–0.5)
Eosinophils Relative: 2 %
HCT: 40.3 % (ref 36.0–46.0)
Hemoglobin: 13.5 g/dL (ref 12.0–15.0)
Immature Granulocytes: 0 %
Lymphocytes Relative: 42 %
Lymphs Abs: 3.1 K/uL (ref 0.7–4.0)
MCH: 26.6 pg (ref 26.0–34.0)
MCHC: 33.5 g/dL (ref 30.0–36.0)
MCV: 79.5 fL — ABNORMAL LOW (ref 80.0–100.0)
Monocytes Absolute: 0.6 K/uL (ref 0.1–1.0)
Monocytes Relative: 8 %
Neutro Abs: 3.5 K/uL (ref 1.7–7.7)
Neutrophils Relative %: 47 %
Platelets: 345 K/uL (ref 150–400)
RBC: 5.07 MIL/uL (ref 3.87–5.11)
RDW: 12.7 % (ref 11.5–15.5)
WBC: 7.4 K/uL (ref 4.0–10.5)
nRBC: 0 % (ref 0.0–0.2)

## 2024-03-19 LAB — COMPREHENSIVE METABOLIC PANEL WITH GFR
ALT: 8 U/L (ref 0–44)
AST: 13 U/L — ABNORMAL LOW (ref 15–41)
Albumin: 4.3 g/dL (ref 3.5–5.0)
Alkaline Phosphatase: 80 U/L (ref 38–126)
Anion gap: 13 (ref 5–15)
BUN: 18 mg/dL (ref 6–20)
CO2: 22 mmol/L (ref 22–32)
Calcium: 9.8 mg/dL (ref 8.9–10.3)
Chloride: 101 mmol/L (ref 98–111)
Creatinine, Ser: 0.87 mg/dL (ref 0.44–1.00)
GFR, Estimated: >60 mL/min (ref >60)
Glucose, Bld: 233 mg/dL — ABNORMAL HIGH (ref 70–99)
Potassium: 4.4 mmol/L (ref 3.5–5.1)
Sodium: 136 mmol/L (ref 135–145)
Total Bilirubin: 0.3 mg/dL (ref 0.0–1.2)
Total Protein: 7.8 g/dL (ref 6.5–8.1)

## 2024-03-19 NOTE — ED Triage Notes (Signed)
  Patient comes in with lower back pain that has been going on for about 1 week.  States pain radiates to bilateral hips but not down legs.  States she had this happen before and accidentally was taking too much ibuprofen so she has not been taking any OTC pain medications.  Pain 10/10, sharp.  Denies any urinary symptoms, or difficulty.

## 2024-03-20 ENCOUNTER — Emergency Department (HOSPITAL_BASED_OUTPATIENT_CLINIC_OR_DEPARTMENT_OTHER)
Admission: EM | Admit: 2024-03-20 | Discharge: 2024-03-20 | Disposition: A | Discharge status: Home / Self Care | Attending: Emergency Medicine | Admitting: Emergency Medicine

## 2024-03-20 ENCOUNTER — Encounter: Payer: Self-pay | Admitting: Hematology

## 2024-03-20 DIAGNOSIS — S39012A Strain of muscle, fascia and tendon of lower back, initial encounter: Secondary | ICD-10-CM

## 2024-03-20 LAB — URINALYSIS, ROUTINE W REFLEX MICROSCOPIC
Bacteria, UA: NONE SEEN
Bilirubin Urine: NEGATIVE
Glucose, UA: >1000 mg/dL — AB
Ketones, ur: NEGATIVE mg/dL
Leukocytes,Ua: NEGATIVE
Nitrite: NEGATIVE
Protein, ur: NEGATIVE mg/dL
Specific Gravity, Urine: >1.046 — ABNORMAL HIGH (ref 1.005–1.030)
pH: 5 (ref 5.0–8.0)

## 2024-03-20 MED ORDER — KETOROLAC TROMETHAMINE 60 MG/2ML IM SOLN
30.0000 mg | Freq: Once | INTRAMUSCULAR | Status: AC
  Administered 2024-03-20: 30 mg via INTRAMUSCULAR
  Filled 2024-03-20: qty 2

## 2024-03-20 NOTE — ED Provider Notes (Signed)
 [place] EMERGENCY DEPARTMENT AT [facility] Provider Note  CSN: [id number] Arrival date & time: 03/19/24 2212  Chief Complaint(s) Back Pain  HPI Amber Jensen is a 43 y.o. female with a past medical history listed below here for 1 week of fluctuating bilateral lower back pain without radiculopathy.  Pain worse with certain positions and movement.  Improved by lying down.  Patient denies any fall or trauma.  No heavy lifting.  No bladder/bowel incontinence.  No lower extremity weakness or loss of sensation.  No fevers or chills.  No urinary symptoms.   Back Pain   Past Medical History Past Medical History:  Diagnosis Date   Diabetes mellitus without complication (HCC)    Hyperlipidemia    Hypertension    Sleep apnea    Patient Active Problem List   Diagnosis Date Noted   Iron deficiency anemia due to chronic blood loss 05/24/2022   Acute renal failure (ARF) (HCC) 06/25/2019   Sepsis (HCC) 06/25/2019   DM (diabetes mellitus), secondary, uncontrolled, with renal complications 06/24/2019   Essential hypertension 06/24/2019   Abscess 06/21/2019   Cellulitis of right lower extremity    Hyperglycemia    Home Medication(s) Prior to Admission medications   Medication Sig Start Date End Date Taking? Authorizing Provider  acetaminophen  (TYLENOL ) 325 MG tablet Take 2 tablets (650 mg total) by mouth every 6 (six) hours as needed for mild pain or fever. 06/27/19   Tammy Sor, PA-C  amLODipine  (NORVASC ) 10 MG tablet Take 1 tablet (10 mg total) by mouth daily. 06/30/19   Marjo Nicky BIRCH, MD  Ascorbic Acid  (VITAMIN C ) 1000 MG tablet Take 1,000 mg by mouth 3 (three) times daily.    [provider]  Biotin  1000 MCG tablet Take 1,000 mcg by mouth daily.    [provider]  Chlorhexidine  Gluconate Cloth 2 % PADS Apply 6 each topically daily at 6 (six) AM. 06/30/19   Marjo Nicky BIRCH, MD  gabapentin  (NEURONTIN ) 100 MG capsule Take 100 mg by mouth 3  (three) times daily as needed (pain).    [provider]  insulin  detemir (LEVEMIR ) 100 UNIT/ML injection Inject 25-45 Units into the skin See admin instructions. 25 units in the am and 45 units at bedtime    [provider]  metFORMIN (GLUCOPHAGE-XR) 500 MG 24 hr tablet Take 500 mg by mouth daily with breakfast.    [provider]  Multiple Vitamin (MULTIVITAMIN WITH MINERALS) TABS tablet Take 1 tablet by mouth daily.    [provider]  ondansetron  (ZOFRAN ) 4 MG tablet Take 1 tablet (4 mg total) by mouth every 8 (eight) hours as needed for nausea or vomiting. 06/29/19   Marjo Nicky BIRCH, MD  ondansetron  (ZOFRAN ) 4 MG tablet Take 1 tablet (4 mg total) by mouth every 4 (four) hours as needed for nausea or vomiting. 09/27/22   Elnor Jayson LABOR, DO  pantoprazole  (PROTONIX ) 40 MG tablet Take 1 tablet (40 mg total) by mouth daily. 02/25/21   Griselda Norris, MD  pantoprazole  (PROTONIX ) 40 MG tablet Take 1 tablet (40 mg total) by mouth daily for 14 days. 09/27/22 10/11/22  Elnor Jayson A, DO  pioglitazone (ACTOS) 45 MG tablet Take 45 mg by mouth daily.    [provider]  Probiotic Product (PROBIOTIC DAILY) CAPS 1 capsule by mouth twice daily while taking antibiotics 06/29/19   Kennerly, Solianny D, MD  sucralfate  (CARAFATE ) 1 g tablet Take 1 tablet (1 g total) by mouth with breakfast, with lunch,  and with evening meal for 7 days. 09/27/22 10/04/22  Elnor Jayson LABOR, DO  traMADol  (ULTRAM ) 50 MG tablet Take 1-2 tablets (50-100 mg total) by mouth every 6 (six) hours as needed for moderate pain or severe pain. 06/27/19   Tammy Sor, PA-C                                                                                                                                    Allergies Gabapentin , Percocet [oxycodone -acetaminophen ], and Vicodin [hydrocodone-acetaminophen ]  Review of Systems Review of Systems  Musculoskeletal:  Positive for back pain.   As noted in  HPI  Physical Exam Vital Signs  I have reviewed the triage vital signs BP (!) 171/85   Pulse 74   Temp 98.3 F (36.8 C)   Resp 17   Ht 5' 6 (1.676 m)   Wt 121.1 kg   LMP 03/17/2024 (Approximate)   SpO2 98%   BMI 43.09 kg/m   Physical Exam Vitals reviewed.  Constitutional:      General: She is not in acute distress.    Appearance: She is well-developed. She is not diaphoretic.  HENT:     Head: Normocephalic and atraumatic.     Right Ear: External ear normal.     Left Ear: External ear normal.     Nose: Nose normal.  Eyes:     General: No scleral icterus.    Conjunctiva/sclera: Conjunctivae normal.  Neck:     Trachea: Phonation normal.  Cardiovascular:     Rate and Rhythm: Normal rate and regular rhythm.  Pulmonary:     Effort: Pulmonary effort is normal. No respiratory distress.     Breath sounds: No stridor.  Abdominal:     General: There is no distension.  Musculoskeletal:        General: Normal range of motion.     Cervical back: Normal range of motion. No bony tenderness.     Thoracic back: No bony tenderness.     Lumbar back: Tenderness present. No bony tenderness.       Back:  Neurological:     Mental Status: She is alert and oriented to person, place, and time.  Psychiatric:        Behavior: Behavior normal.     ED Results and Treatments Labs (all labs ordered are listed, but only abnormal results are displayed) Labs Reviewed  CBC WITH DIFFERENTIAL/PLATELET - Abnormal; Notable for the following components:      Result Value   MCV 79.5 (*)    All other components within normal limits  COMPREHENSIVE METABOLIC PANEL WITH GFR - Abnormal; Notable for the following components:   Glucose, Bld 233 (*)    AST 13 (*)    All other components within normal limits  URINALYSIS, ROUTINE W REFLEX MICROSCOPIC - Abnormal; Notable for the following components:   Specific Gravity, Urine 1.044 (*)    Glucose, UA >1,000 (*)  Hgb urine dipstick LARGE (*)     Leukocytes,Ua SMALL (*)    Bacteria, UA RARE (*)    All other components within normal limits  URINALYSIS, ROUTINE W REFLEX MICROSCOPIC - Abnormal; Notable for the following components:   Specific Gravity, Urine >1.046 (*)    Glucose, UA >1,000 (*)    Hgb urine dipstick MODERATE (*)    All other components within normal limits                                                                                                                         EKG  EKG Interpretation Date/Time:    Ventricular Rate:    PR Interval:    QRS Duration:    QT Interval:    QTC Calculation:   R Axis:      Text Interpretation:         Radiology No results found.  Medications Ordered in ED Medications  ketorolac  (TORADOL ) injection 30 mg (30 mg Intramuscular Given 03/20/24 0330)   Procedures Procedures  (including critical care time) Medical Decision Making / ED Course   Medical Decision Making Amount and/or Complexity of Data Reviewed Labs: ordered. Decision-making details documented in ED Course.  Risk Prescription drug management.    Lower back pain differential diagnosis considered CBC without leukocytosis.  Initial UA contaminated but repeat not concerning for infection. Metabolic panel without significant electrolyte derangements or renal sufficiency.  No biliary obstruction. Exam not concerning for cauda equina or serious intraspinal process.  No falls or midline tenderness concerning for vertebral fractures.   Favoring muscular strain/spasm.  Pain did improve with IM Toradol .  Supportive management recommended    Final Clinical Impression(s) / ED Diagnoses Final diagnoses:  Strain of lumbar region, initial encounter   The patient appears reasonably screened and/or stabilized for discharge and I doubt any other medical condition or other [facility] requiring further screening, evaluation, or treatment in the ED at this time. I have discussed the findings, Dx and Tx plan with the  patient/family who expressed understanding and agree(s) with the plan. Discharge instructions discussed at length. The patient/family was given strict return precautions who verbalized understanding of the instructions. No further questions at time of discharge.  Disposition: Discharge  Condition: Good  ED Discharge Orders     None        Follow Up: Clinic, [facility] Lien [address] [place] [facility] [facility] [place] [address] [phone]  Call  to schedule an appointment for close follow up    This chart was dictated using voice recognition software.  Despite best efforts to proofread,  errors can occur which can change the documentation meaning.    Trine Raynell Moder, MD 03/20/24 [phone]

## 2024-03-20 NOTE — Discharge Instructions (Signed)

## 2024-05-26 ENCOUNTER — Encounter (INDEPENDENT_AMBULATORY_CARE_PROVIDER_SITE_OTHER): Payer: Self-pay

## 2024-05-26 ENCOUNTER — Encounter (INDEPENDENT_AMBULATORY_CARE_PROVIDER_SITE_OTHER): Admitting: Ophthalmology
